# Patient Record
Sex: Female | Born: 1940 | Race: White | Hispanic: No | Marital: Married | Smoking: Former smoker
Health system: Southern US, Community
[De-identification: ages and names within clinical notes are randomized; demographics above are authoritative.]

## PROBLEM LIST (undated history)

## (undated) DIAGNOSIS — F329 Major depressive disorder, single episode, unspecified: Secondary | ICD-10-CM

## (undated) DIAGNOSIS — C801 Malignant (primary) neoplasm, unspecified: Secondary | ICD-10-CM

## (undated) DIAGNOSIS — M961 Postlaminectomy syndrome, not elsewhere classified: Secondary | ICD-10-CM

## (undated) DIAGNOSIS — M47817 Spondylosis without myelopathy or radiculopathy, lumbosacral region: Secondary | ICD-10-CM

## (undated) DIAGNOSIS — R209 Unspecified disturbances of skin sensation: Secondary | ICD-10-CM

## (undated) DIAGNOSIS — F32A Depression, unspecified: Secondary | ICD-10-CM

## (undated) DIAGNOSIS — I10 Essential (primary) hypertension: Secondary | ICD-10-CM

## (undated) DIAGNOSIS — IMO0002 Reserved for concepts with insufficient information to code with codable children: Secondary | ICD-10-CM

## (undated) DIAGNOSIS — E785 Hyperlipidemia, unspecified: Secondary | ICD-10-CM

## (undated) DIAGNOSIS — M810 Age-related osteoporosis without current pathological fracture: Secondary | ICD-10-CM

## (undated) DIAGNOSIS — M533 Sacrococcygeal disorders, not elsewhere classified: Secondary | ICD-10-CM

## (undated) HISTORY — DX: Hyperlipidemia, unspecified: E78.5

## (undated) HISTORY — DX: Malignant (primary) neoplasm, unspecified: C80.1

## (undated) HISTORY — DX: Depression, unspecified: F32.A

## (undated) HISTORY — DX: Unspecified disturbances of skin sensation: R20.9

## (undated) HISTORY — DX: Spondylosis without myelopathy or radiculopathy, lumbosacral region: M47.817

## (undated) HISTORY — DX: Age-related osteoporosis without current pathological fracture: M81.0

## (undated) HISTORY — DX: Essential (primary) hypertension: I10

## (undated) HISTORY — DX: Sacrococcygeal disorders, not elsewhere classified: M53.3

## (undated) HISTORY — PX: EYE SURGERY: SHX253

## (undated) HISTORY — PX: VAGOTOMY AND PYLOROPLASTY: SHX831

## (undated) HISTORY — DX: Postlaminectomy syndrome, not elsewhere classified: M96.1

## (undated) HISTORY — DX: Major depressive disorder, single episode, unspecified: F32.9

## (undated) HISTORY — PX: OTHER SURGICAL HISTORY: SHX169

## (undated) HISTORY — PX: SPINE SURGERY: SHX786

## (undated) HISTORY — DX: Reserved for concepts with insufficient information to code with codable children: IMO0002

---

## 1998-07-04 ENCOUNTER — Other Ambulatory Visit: Admission: RE | Admit: 1998-07-04 | Discharge: 1998-07-04 | Payer: Self-pay | Admitting: Obstetrics and Gynecology

## 1999-10-10 ENCOUNTER — Encounter: Admission: RE | Admit: 1999-10-10 | Discharge: 1999-10-10 | Payer: Self-pay | Admitting: Obstetrics and Gynecology

## 1999-10-10 ENCOUNTER — Encounter: Payer: Self-pay | Admitting: Obstetrics and Gynecology

## 1999-10-22 ENCOUNTER — Encounter: Admission: RE | Admit: 1999-10-22 | Discharge: 1999-10-22 | Payer: Self-pay | Admitting: Obstetrics and Gynecology

## 1999-10-22 ENCOUNTER — Encounter: Payer: Self-pay | Admitting: Obstetrics and Gynecology

## 1999-11-13 ENCOUNTER — Other Ambulatory Visit: Admission: RE | Admit: 1999-11-13 | Discharge: 1999-11-13 | Payer: Self-pay | Admitting: Obstetrics and Gynecology

## 2001-01-13 ENCOUNTER — Other Ambulatory Visit: Admission: RE | Admit: 2001-01-13 | Discharge: 2001-01-13 | Payer: Self-pay | Admitting: Obstetrics and Gynecology

## 2001-05-02 ENCOUNTER — Encounter: Payer: Self-pay | Admitting: Obstetrics and Gynecology

## 2001-05-02 ENCOUNTER — Encounter: Admission: RE | Admit: 2001-05-02 | Discharge: 2001-05-02 | Payer: Self-pay | Admitting: Obstetrics and Gynecology

## 2002-03-29 ENCOUNTER — Other Ambulatory Visit: Admission: RE | Admit: 2002-03-29 | Discharge: 2002-03-29 | Payer: Self-pay | Admitting: Obstetrics and Gynecology

## 2002-05-04 ENCOUNTER — Encounter: Admission: RE | Admit: 2002-05-04 | Discharge: 2002-05-04 | Payer: Self-pay | Admitting: Obstetrics and Gynecology

## 2002-05-04 ENCOUNTER — Encounter: Payer: Self-pay | Admitting: Obstetrics and Gynecology

## 2003-06-05 ENCOUNTER — Other Ambulatory Visit: Admission: RE | Admit: 2003-06-05 | Discharge: 2003-06-05 | Payer: Self-pay | Admitting: Obstetrics and Gynecology

## 2003-07-17 ENCOUNTER — Encounter: Payer: Self-pay | Admitting: Obstetrics and Gynecology

## 2003-07-17 ENCOUNTER — Encounter: Admission: RE | Admit: 2003-07-17 | Discharge: 2003-07-17 | Payer: Self-pay | Admitting: Obstetrics and Gynecology

## 2004-09-22 ENCOUNTER — Encounter: Admission: RE | Admit: 2004-09-22 | Discharge: 2004-09-22 | Payer: Self-pay | Admitting: Emergency Medicine

## 2004-10-01 ENCOUNTER — Encounter: Admission: RE | Admit: 2004-10-01 | Discharge: 2004-10-01 | Payer: Self-pay | Admitting: Emergency Medicine

## 2004-10-15 ENCOUNTER — Encounter: Admission: RE | Admit: 2004-10-15 | Discharge: 2004-10-15 | Payer: Self-pay | Admitting: Emergency Medicine

## 2004-10-29 ENCOUNTER — Encounter: Admission: RE | Admit: 2004-10-29 | Discharge: 2004-10-29 | Payer: Self-pay | Admitting: Emergency Medicine

## 2005-03-18 ENCOUNTER — Ambulatory Visit (HOSPITAL_COMMUNITY): Admission: RE | Admit: 2005-03-18 | Discharge: 2005-03-18 | Payer: Self-pay | Admitting: Gastroenterology

## 2005-09-14 ENCOUNTER — Other Ambulatory Visit: Admission: RE | Admit: 2005-09-14 | Discharge: 2005-09-14 | Payer: Self-pay | Admitting: Obstetrics and Gynecology

## 2005-11-02 ENCOUNTER — Encounter: Admission: RE | Admit: 2005-11-02 | Discharge: 2005-11-02 | Payer: Self-pay | Admitting: Obstetrics and Gynecology

## 2006-03-09 ENCOUNTER — Encounter: Admission: RE | Admit: 2006-03-09 | Discharge: 2006-03-09 | Payer: Self-pay | Admitting: Orthopaedic Surgery

## 2006-11-09 ENCOUNTER — Encounter: Admission: RE | Admit: 2006-11-09 | Discharge: 2006-11-09 | Payer: Self-pay | Admitting: Family Medicine

## 2006-11-15 ENCOUNTER — Other Ambulatory Visit: Admission: RE | Admit: 2006-11-15 | Discharge: 2006-11-15 | Payer: Self-pay | Admitting: Obstetrics and Gynecology

## 2007-02-04 ENCOUNTER — Inpatient Hospital Stay (HOSPITAL_BASED_OUTPATIENT_CLINIC_OR_DEPARTMENT_OTHER): Admission: RE | Admit: 2007-02-04 | Discharge: 2007-02-04 | Payer: Self-pay | Admitting: Cardiovascular Disease

## 2007-04-20 ENCOUNTER — Ambulatory Visit (HOSPITAL_COMMUNITY): Admission: RE | Admit: 2007-04-20 | Discharge: 2007-04-20 | Payer: Self-pay | Admitting: Gastroenterology

## 2007-04-20 ENCOUNTER — Encounter (INDEPENDENT_AMBULATORY_CARE_PROVIDER_SITE_OTHER): Payer: Self-pay | Admitting: Gastroenterology

## 2007-04-21 ENCOUNTER — Encounter: Admission: RE | Admit: 2007-04-21 | Discharge: 2007-04-21 | Payer: Self-pay | Admitting: Physician Assistant

## 2007-06-08 ENCOUNTER — Encounter (INDEPENDENT_AMBULATORY_CARE_PROVIDER_SITE_OTHER): Payer: Self-pay | Admitting: Gastroenterology

## 2007-06-08 ENCOUNTER — Ambulatory Visit (HOSPITAL_COMMUNITY): Admission: RE | Admit: 2007-06-08 | Discharge: 2007-06-08 | Payer: Self-pay | Admitting: Gastroenterology

## 2007-11-14 ENCOUNTER — Encounter: Admission: RE | Admit: 2007-11-14 | Discharge: 2007-11-14 | Payer: Self-pay | Admitting: Obstetrics and Gynecology

## 2007-11-17 ENCOUNTER — Other Ambulatory Visit: Admission: RE | Admit: 2007-11-17 | Discharge: 2007-11-17 | Payer: Self-pay | Admitting: Obstetrics and Gynecology

## 2007-11-18 ENCOUNTER — Encounter: Admission: RE | Admit: 2007-11-18 | Discharge: 2007-11-18 | Payer: Self-pay | Admitting: Obstetrics and Gynecology

## 2008-10-26 ENCOUNTER — Encounter: Admission: RE | Admit: 2008-10-26 | Discharge: 2008-10-26 | Payer: Self-pay | Admitting: Emergency Medicine

## 2008-11-02 ENCOUNTER — Encounter: Admission: RE | Admit: 2008-11-02 | Discharge: 2008-11-02 | Payer: Self-pay | Admitting: Emergency Medicine

## 2008-11-16 ENCOUNTER — Encounter: Admission: RE | Admit: 2008-11-16 | Discharge: 2008-11-16 | Payer: Self-pay | Admitting: Emergency Medicine

## 2009-01-01 ENCOUNTER — Encounter: Admission: RE | Admit: 2009-01-01 | Discharge: 2009-01-01 | Payer: Self-pay | Admitting: Obstetrics and Gynecology

## 2009-01-04 ENCOUNTER — Other Ambulatory Visit: Admission: RE | Admit: 2009-01-04 | Discharge: 2009-01-04 | Payer: Self-pay | Admitting: Obstetrics and Gynecology

## 2009-02-22 ENCOUNTER — Encounter: Admission: RE | Admit: 2009-02-22 | Discharge: 2009-02-22 | Payer: Self-pay | Admitting: Emergency Medicine

## 2009-04-15 ENCOUNTER — Inpatient Hospital Stay (HOSPITAL_COMMUNITY): Admission: EM | Admit: 2009-04-15 | Discharge: 2009-04-27 | Payer: Self-pay | Admitting: Emergency Medicine

## 2009-04-17 ENCOUNTER — Encounter (INDEPENDENT_AMBULATORY_CARE_PROVIDER_SITE_OTHER): Payer: Self-pay | Admitting: Gastroenterology

## 2009-04-19 ENCOUNTER — Encounter (INDEPENDENT_AMBULATORY_CARE_PROVIDER_SITE_OTHER): Payer: Self-pay | Admitting: General Surgery

## 2009-06-13 ENCOUNTER — Encounter: Admission: RE | Admit: 2009-06-13 | Discharge: 2009-06-13 | Payer: Self-pay | Admitting: Neurosurgery

## 2009-08-09 ENCOUNTER — Encounter
Admission: RE | Admit: 2009-08-09 | Discharge: 2009-10-15 | Payer: Self-pay | Admitting: Physical Medicine & Rehabilitation

## 2009-08-12 ENCOUNTER — Ambulatory Visit: Payer: Self-pay | Admitting: Physical Medicine & Rehabilitation

## 2009-08-19 ENCOUNTER — Ambulatory Visit: Payer: Self-pay | Admitting: Physical Medicine & Rehabilitation

## 2009-09-23 ENCOUNTER — Ambulatory Visit: Payer: Self-pay | Admitting: Physical Medicine & Rehabilitation

## 2009-10-28 ENCOUNTER — Ambulatory Visit: Payer: Self-pay | Admitting: Physical Medicine & Rehabilitation

## 2009-10-28 ENCOUNTER — Encounter
Admission: RE | Admit: 2009-10-28 | Discharge: 2010-01-26 | Payer: Self-pay | Admitting: Physical Medicine & Rehabilitation

## 2009-12-09 ENCOUNTER — Ambulatory Visit: Payer: Self-pay | Admitting: Physical Medicine & Rehabilitation

## 2009-12-17 ENCOUNTER — Encounter: Admission: RE | Admit: 2009-12-17 | Discharge: 2009-12-17 | Payer: Self-pay | Admitting: Emergency Medicine

## 2010-01-03 ENCOUNTER — Encounter: Admission: RE | Admit: 2010-01-03 | Discharge: 2010-01-03 | Payer: Self-pay | Admitting: Emergency Medicine

## 2010-01-10 ENCOUNTER — Ambulatory Visit: Payer: Self-pay | Admitting: Physical Medicine & Rehabilitation

## 2010-02-18 ENCOUNTER — Encounter
Admission: RE | Admit: 2010-02-18 | Discharge: 2010-05-19 | Payer: Self-pay | Admitting: Physical Medicine & Rehabilitation

## 2010-02-21 ENCOUNTER — Ambulatory Visit: Payer: Self-pay | Admitting: Physical Medicine & Rehabilitation

## 2010-03-31 ENCOUNTER — Ambulatory Visit: Payer: Self-pay | Admitting: Physical Medicine & Rehabilitation

## 2010-05-09 ENCOUNTER — Ambulatory Visit: Payer: Self-pay | Admitting: Physical Medicine & Rehabilitation

## 2010-07-03 ENCOUNTER — Encounter
Admission: RE | Admit: 2010-07-03 | Discharge: 2010-07-07 | Payer: Self-pay | Source: Home / Self Care | Attending: Physical Medicine & Rehabilitation | Admitting: Physical Medicine & Rehabilitation

## 2010-07-07 ENCOUNTER — Ambulatory Visit: Payer: Self-pay | Admitting: Physical Medicine & Rehabilitation

## 2010-07-16 ENCOUNTER — Ambulatory Visit: Payer: Self-pay | Admitting: Cardiovascular Disease

## 2010-10-01 ENCOUNTER — Encounter
Admission: RE | Admit: 2010-10-01 | Discharge: 2010-10-07 | Payer: Self-pay | Source: Home / Self Care | Attending: Physical Medicine & Rehabilitation | Admitting: Physical Medicine & Rehabilitation

## 2010-10-07 ENCOUNTER — Ambulatory Visit: Payer: Self-pay | Admitting: Physical Medicine & Rehabilitation

## 2010-11-09 ENCOUNTER — Encounter: Payer: Self-pay | Admitting: Gastroenterology

## 2010-11-10 ENCOUNTER — Encounter: Payer: Self-pay | Admitting: Neurosurgery

## 2010-11-13 ENCOUNTER — Encounter
Admission: RE | Admit: 2010-11-13 | Discharge: 2010-11-18 | Payer: Self-pay | Source: Home / Self Care | Attending: Physical Medicine & Rehabilitation | Admitting: Physical Medicine & Rehabilitation

## 2010-11-17 ENCOUNTER — Ambulatory Visit
Admission: RE | Admit: 2010-11-17 | Discharge: 2010-11-17 | Payer: Self-pay | Source: Home / Self Care | Attending: Physical Medicine & Rehabilitation | Admitting: Physical Medicine & Rehabilitation

## 2010-12-02 ENCOUNTER — Other Ambulatory Visit: Payer: Self-pay | Admitting: Emergency Medicine

## 2010-12-02 ENCOUNTER — Ambulatory Visit
Admission: RE | Admit: 2010-12-02 | Discharge: 2010-12-02 | Disposition: A | Discharge status: Home / Self Care | Payer: Self-pay | Source: Ambulatory Visit | Attending: Emergency Medicine | Admitting: Emergency Medicine

## 2010-12-02 DIAGNOSIS — M25476 Effusion, unspecified foot: Secondary | ICD-10-CM

## 2010-12-02 DIAGNOSIS — M25473 Effusion, unspecified ankle: Secondary | ICD-10-CM

## 2010-12-16 ENCOUNTER — Encounter: Payer: Medicare Other | Attending: Physical Medicine & Rehabilitation

## 2010-12-16 ENCOUNTER — Ambulatory Visit: Payer: Medicare Other | Admitting: Physical Medicine & Rehabilitation

## 2010-12-16 DIAGNOSIS — M47817 Spondylosis without myelopathy or radiculopathy, lumbosacral region: Secondary | ICD-10-CM

## 2010-12-16 DIAGNOSIS — M545 Low back pain, unspecified: Secondary | ICD-10-CM | POA: Insufficient documentation

## 2010-12-17 ENCOUNTER — Other Ambulatory Visit: Payer: Self-pay | Admitting: Emergency Medicine

## 2010-12-17 DIAGNOSIS — Z1231 Encounter for screening mammogram for malignant neoplasm of breast: Secondary | ICD-10-CM

## 2011-01-05 ENCOUNTER — Ambulatory Visit
Admission: RE | Admit: 2011-01-05 | Discharge: 2011-01-05 | Disposition: A | Discharge status: Home / Self Care | Payer: Medicare Other | Source: Ambulatory Visit | Attending: Emergency Medicine | Admitting: Emergency Medicine

## 2011-01-05 DIAGNOSIS — Z1231 Encounter for screening mammogram for malignant neoplasm of breast: Secondary | ICD-10-CM

## 2011-01-09 NOTE — Assessment & Plan Note (Addendum)
REASON FOR VISIT:  Right greater than left-sided back pain.  HISTORY:  Lumbar spondylosis, has had 100% relief which was several hours with medial branch blocks, bilateral L2, L3, L4 on Feb 21, 2010. Repeat March 31, 2010 with greater than 50% relief and then repeated once again November 17, 2010 with a short-term 80% relief and now is down to 50%.  A 45-minute walking tolerance, ability to climb steps is positive, does not drive.  Oswestry score 50% indicating moderate-to-severe disability from back.  PHYSICAL EXAMINATION:  Lower extremity strength is normal.  Straight leg raising test is negative.  Deep tendon reflexes are normal in the lower extremity.  She has tenderness to palpation lumbar paraspinals, at the L4 and above level on the right side greater than left side.  Pain exist with flexion, extension, lateral rotation, bending and twisting.  IMPRESSION:  Lumbar spondylosis without myelopathy post laminectomy syndrome.  She does have x-ray evidence of facet arthrosis at L3-4 and L4-5 levels.  We will set her radiofrequency enterotomy right side first and if successful on that side, we would proceed on left side. Discussed with the patient husband, went over risks and benefits.  They elect to schedule.  We will premedicate with 5 mg of Valium.     Erick Colace, M.D. Electronically Signed    AEK/MedQ D:  12/16/2010 09:19:03  T:  12/16/2010 09:38:22  Job #:  [id number]

## 2011-01-25 LAB — CBC
HCT: 34.4 % — ABNORMAL LOW (ref 36.0–46.0)
HCT: 34.8 % — ABNORMAL LOW (ref 36.0–46.0)
HCT: 35.3 % — ABNORMAL LOW (ref 36.0–46.0)
HCT: 36.3 % (ref 36.0–46.0)
Hemoglobin: 11.9 g/dL — ABNORMAL LOW (ref 12.0–15.0)
Hemoglobin: 12.1 g/dL (ref 12.0–15.0)
MCHC: 33.7 g/dL (ref 30.0–36.0)
MCHC: 34.1 g/dL (ref 30.0–36.0)
MCHC: 34.2 g/dL (ref 30.0–36.0)
MCHC: 34.2 g/dL (ref 30.0–36.0)
MCHC: 34.3 g/dL (ref 30.0–36.0)
MCV: 100.6 fL — ABNORMAL HIGH (ref 78.0–100.0)
MCV: 100.9 fL — ABNORMAL HIGH (ref 78.0–100.0)
MCV: 101 fL — ABNORMAL HIGH (ref 78.0–100.0)
MCV: 102.7 fL — ABNORMAL HIGH (ref 78.0–100.0)
MCV: 102.7 fL — ABNORMAL HIGH (ref 78.0–100.0)
Platelets: 206 10*3/uL (ref 150–400)
Platelets: 209 10*3/uL (ref 150–400)
Platelets: 215 10*3/uL (ref 150–400)
Platelets: 231 10*3/uL (ref 150–400)
Platelets: 242 10*3/uL (ref 150–400)
RBC: 3.27 MIL/uL — ABNORMAL LOW (ref 3.87–5.11)
RBC: 3.43 MIL/uL — ABNORMAL LOW (ref 3.87–5.11)
RBC: 3.48 MIL/uL — ABNORMAL LOW (ref 3.87–5.11)
RDW: 12.6 % (ref 11.5–15.5)
RDW: 12.7 % (ref 11.5–15.5)
RDW: 13 % (ref 11.5–15.5)
RDW: 13.1 % (ref 11.5–15.5)
WBC: 11.4 10*3/uL — ABNORMAL HIGH (ref 4.0–10.5)
WBC: 12 10*3/uL — ABNORMAL HIGH (ref 4.0–10.5)
WBC: 12.3 10*3/uL — ABNORMAL HIGH (ref 4.0–10.5)
WBC: 19.3 10*3/uL — ABNORMAL HIGH (ref 4.0–10.5)

## 2011-01-25 LAB — DIFFERENTIAL
Basophils Absolute: 0 10*3/uL (ref 0.0–0.1)
Basophils Absolute: 0 10*3/uL (ref 0.0–0.1)
Basophils Absolute: 0 10*3/uL (ref 0.0–0.1)
Basophils Absolute: 0.1 10*3/uL (ref 0.0–0.1)
Basophils Relative: 0 % (ref 0–1)
Basophils Relative: 0 % (ref 0–1)
Basophils Relative: 0 % (ref 0–1)
Basophils Relative: 1 % (ref 0–1)
Eosinophils Absolute: 0 10*3/uL (ref 0.0–0.7)
Eosinophils Absolute: 0.3 10*3/uL (ref 0.0–0.7)
Eosinophils Absolute: 0.4 10*3/uL (ref 0.0–0.7)
Eosinophils Absolute: 0.4 10*3/uL (ref 0.0–0.7)
Eosinophils Relative: 0 % (ref 0–5)
Eosinophils Relative: 3 % (ref 0–5)
Eosinophils Relative: 3 % (ref 0–5)
Lymphocytes Relative: 18 % (ref 12–46)
Lymphocytes Relative: 2 % — ABNORMAL LOW (ref 12–46)
Lymphocytes Relative: 7 % — ABNORMAL LOW (ref 12–46)
Lymphs Abs: 0.4 10*3/uL — ABNORMAL LOW (ref 0.7–4.0)
Lymphs Abs: 1.4 10*3/uL (ref 0.7–4.0)
Lymphs Abs: 2.2 10*3/uL (ref 0.7–4.0)
Monocytes Absolute: 0.5 10*3/uL (ref 0.1–1.0)
Monocytes Absolute: 1.1 10*3/uL — ABNORMAL HIGH (ref 0.1–1.0)
Monocytes Relative: 11 % (ref 3–12)
Monocytes Relative: 3 % (ref 3–12)
Neutro Abs: 13.6 10*3/uL — ABNORMAL HIGH (ref 1.7–7.7)
Neutro Abs: 8.6 10*3/uL — ABNORMAL HIGH (ref 1.7–7.7)
Neutro Abs: 8.6 10*3/uL — ABNORMAL HIGH (ref 1.7–7.7)
Neutrophils Relative %: 73 % (ref 43–77)
Neutrophils Relative %: 74 % (ref 43–77)
Neutrophils Relative %: 75 % (ref 43–77)
Neutrophils Relative %: 86 % — ABNORMAL HIGH (ref 43–77)

## 2011-01-25 LAB — GLUCOSE, CAPILLARY
Glucose-Capillary: 102 mg/dL — ABNORMAL HIGH (ref 70–99)
Glucose-Capillary: 104 mg/dL — ABNORMAL HIGH (ref 70–99)
Glucose-Capillary: 107 mg/dL — ABNORMAL HIGH (ref 70–99)
Glucose-Capillary: 108 mg/dL — ABNORMAL HIGH (ref 70–99)
Glucose-Capillary: 108 mg/dL — ABNORMAL HIGH (ref 70–99)
Glucose-Capillary: 113 mg/dL — ABNORMAL HIGH (ref 70–99)
Glucose-Capillary: 113 mg/dL — ABNORMAL HIGH (ref 70–99)
Glucose-Capillary: 114 mg/dL — ABNORMAL HIGH (ref 70–99)
Glucose-Capillary: 116 mg/dL — ABNORMAL HIGH (ref 70–99)
Glucose-Capillary: 118 mg/dL — ABNORMAL HIGH (ref 70–99)
Glucose-Capillary: 123 mg/dL — ABNORMAL HIGH (ref 70–99)
Glucose-Capillary: 126 mg/dL — ABNORMAL HIGH (ref 70–99)
Glucose-Capillary: 126 mg/dL — ABNORMAL HIGH (ref 70–99)
Glucose-Capillary: 126 mg/dL — ABNORMAL HIGH (ref 70–99)
Glucose-Capillary: 127 mg/dL — ABNORMAL HIGH (ref 70–99)
Glucose-Capillary: 129 mg/dL — ABNORMAL HIGH (ref 70–99)
Glucose-Capillary: 135 mg/dL — ABNORMAL HIGH (ref 70–99)
Glucose-Capillary: 138 mg/dL — ABNORMAL HIGH (ref 70–99)
Glucose-Capillary: 143 mg/dL — ABNORMAL HIGH (ref 70–99)
Glucose-Capillary: 161 mg/dL — ABNORMAL HIGH (ref 70–99)
Glucose-Capillary: 245 mg/dL — ABNORMAL HIGH (ref 70–99)
Glucose-Capillary: 246 mg/dL — ABNORMAL HIGH (ref 70–99)
Glucose-Capillary: 93 mg/dL (ref 70–99)
Glucose-Capillary: 99 mg/dL (ref 70–99)
Glucose-Capillary: 99 mg/dL (ref 70–99)

## 2011-01-25 LAB — BASIC METABOLIC PANEL
BUN: 1 mg/dL — ABNORMAL LOW (ref 6–23)
BUN: 2 mg/dL — ABNORMAL LOW (ref 6–23)
BUN: 3 mg/dL — ABNORMAL LOW (ref 6–23)
BUN: 3 mg/dL — ABNORMAL LOW (ref 6–23)
BUN: 3 mg/dL — ABNORMAL LOW (ref 6–23)
BUN: 5 mg/dL — ABNORMAL LOW (ref 6–23)
CO2: 23 mEq/L (ref 19–32)
CO2: 26 mEq/L (ref 19–32)
CO2: 26 mEq/L (ref 19–32)
CO2: 28 mEq/L (ref 19–32)
Calcium: 7.7 mg/dL — ABNORMAL LOW (ref 8.4–10.5)
Calcium: 8 mg/dL — ABNORMAL LOW (ref 8.4–10.5)
Calcium: 8.3 mg/dL — ABNORMAL LOW (ref 8.4–10.5)
Calcium: 8.5 mg/dL (ref 8.4–10.5)
Calcium: 8.7 mg/dL (ref 8.4–10.5)
Chloride: 103 mEq/L (ref 96–112)
Chloride: 105 mEq/L (ref 96–112)
Chloride: 106 mEq/L (ref 96–112)
Chloride: 109 mEq/L (ref 96–112)
Creatinine, Ser: 0.64 mg/dL (ref 0.4–1.2)
Creatinine, Ser: 0.65 mg/dL (ref 0.4–1.2)
Creatinine, Ser: 0.72 mg/dL (ref 0.4–1.2)
Creatinine, Ser: 0.76 mg/dL (ref 0.4–1.2)
Creatinine, Ser: 0.84 mg/dL (ref 0.4–1.2)
GFR calc Af Amer: >60 mL/min (ref >60)
GFR calc Af Amer: >60 mL/min (ref >60)
GFR calc Af Amer: >60 mL/min (ref >60)
GFR calc Af Amer: >60 mL/min (ref >60)
GFR calc non Af Amer: >60 mL/min (ref >60)
GFR calc non Af Amer: >60 mL/min (ref >60)
GFR calc non Af Amer: >60 mL/min (ref >60)
GFR calc non Af Amer: >60 mL/min (ref >60)
GFR calc non Af Amer: >60 mL/min (ref >60)
Glucose, Bld: 108 mg/dL — ABNORMAL HIGH (ref 70–99)
Glucose, Bld: 114 mg/dL — ABNORMAL HIGH (ref 70–99)
Glucose, Bld: 116 mg/dL — ABNORMAL HIGH (ref 70–99)
Glucose, Bld: 132 mg/dL — ABNORMAL HIGH (ref 70–99)
Glucose, Bld: 251 mg/dL — ABNORMAL HIGH (ref 70–99)
Glucose, Bld: 98 mg/dL (ref 70–99)
Potassium: 3.1 mEq/L — ABNORMAL LOW (ref 3.5–5.1)
Potassium: 3.3 mEq/L — ABNORMAL LOW (ref 3.5–5.1)
Potassium: 3.6 mEq/L (ref 3.5–5.1)
Potassium: 4.7 mEq/L (ref 3.5–5.1)
Sodium: 138 mEq/L (ref 135–145)
Sodium: 140 mEq/L (ref 135–145)
Sodium: 142 mEq/L (ref 135–145)

## 2011-01-25 LAB — MAGNESIUM
Magnesium: 0.8 mg/dL — CL (ref 1.5–2.5)
Magnesium: 1 mg/dL — ABNORMAL LOW (ref 1.5–2.5)
Magnesium: 1.2 mg/dL — ABNORMAL LOW (ref 1.5–2.5)
Magnesium: 1.5 mg/dL (ref 1.5–2.5)
Magnesium: 1.8 mg/dL (ref 1.5–2.5)
Magnesium: 1.8 mg/dL (ref 1.5–2.5)
Magnesium: 2.4 mg/dL (ref 1.5–2.5)

## 2011-01-25 LAB — HEMOGLOBIN A1C: Hgb A1c MFr Bld: 5.7 % (ref 4.6–6.1)

## 2011-01-25 LAB — PHOSPHORUS: Phosphorus: 3.4 mg/dL (ref 2.3–4.6)

## 2011-01-25 LAB — VITAMIN B12: Vitamin B-12: 186 pg/mL — ABNORMAL LOW (ref 211–911)

## 2011-01-25 LAB — FOLATE: Folate: >20 ng/mL

## 2011-01-26 LAB — POCT I-STAT, CHEM 8
BUN: 23 mg/dL (ref 6–23)
Calcium, Ion: 0.9 mmol/L — ABNORMAL LOW (ref 1.12–1.32)
Creatinine, Ser: 1.6 mg/dL — ABNORMAL HIGH (ref 0.4–1.2)
Sodium: 130 mEq/L — ABNORMAL LOW (ref 135–145)
TCO2: 38 mmol/L (ref 0–100)

## 2011-01-26 LAB — CBC
HCT: 35.2 % — ABNORMAL LOW (ref 36.0–46.0)
HCT: 45.7 % (ref 36.0–46.0)
Hemoglobin: 11.6 g/dL — ABNORMAL LOW (ref 12.0–15.0)
Hemoglobin: 15.4 g/dL — ABNORMAL HIGH (ref 12.0–15.0)
MCHC: 33.7 g/dL (ref 30.0–36.0)
MCHC: 33.8 g/dL (ref 30.0–36.0)
MCV: 101.9 fL — ABNORMAL HIGH (ref 78.0–100.0)
MCV: 103 fL — ABNORMAL HIGH (ref 78.0–100.0)
MCV: 103.1 fL — ABNORMAL HIGH (ref 78.0–100.0)
Platelets: 263 K/uL (ref 150–400)
RBC: 3.3 MIL/uL — ABNORMAL LOW (ref 3.87–5.11)
RBC: 3.41 MIL/uL — ABNORMAL LOW (ref 3.87–5.11)
RBC: 4.48 MIL/uL (ref 3.87–5.11)
RDW: 13.5 % (ref 11.5–15.5)
WBC: 14.7 K/uL — ABNORMAL HIGH (ref 4.0–10.5)
WBC: 9.3 10*3/uL (ref 4.0–10.5)

## 2011-01-26 LAB — PROTIME-INR: Prothrombin Time: 13.6 seconds (ref 11.6–15.2)

## 2011-01-26 LAB — DIFFERENTIAL
Eosinophils Relative: 0 % (ref 0–5)
Lymphocytes Relative: 10 % — ABNORMAL LOW (ref 12–46)
Lymphs Abs: 1.5 10*3/uL (ref 0.7–4.0)
Monocytes Absolute: 1.6 10*3/uL — ABNORMAL HIGH (ref 0.1–1.0)

## 2011-01-26 LAB — LIPASE, BLOOD: Lipase: 16 U/L (ref 11–59)

## 2011-01-26 LAB — HEPATIC FUNCTION PANEL
Alkaline Phosphatase: 38 U/L — ABNORMAL LOW (ref 39–117)
Bilirubin, Direct: 0.1 mg/dL (ref 0.0–0.3)
Indirect Bilirubin: 0.4 mg/dL (ref 0.3–0.9)
Total Protein: 5.4 g/dL — ABNORMAL LOW (ref 6.0–8.3)

## 2011-01-26 LAB — BASIC METABOLIC PANEL
BUN: 18 mg/dL (ref 6–23)
BUN: 7 mg/dL (ref 6–23)
CO2: 30 mEq/L (ref 19–32)
Chloride: 100 mEq/L (ref 96–112)
Chloride: 106 mEq/L (ref 96–112)
Creatinine, Ser: 1.13 mg/dL (ref 0.4–1.2)
GFR calc Af Amer: 58 mL/min — ABNORMAL LOW (ref >60)
GFR calc Af Amer: >60 mL/min (ref >60)
Potassium: 3.2 mEq/L — ABNORMAL LOW (ref 3.5–5.1)
Sodium: 139 mEq/L (ref 135–145)

## 2011-01-26 LAB — APTT
aPTT: 24 s (ref 24–37)
aPTT: 24 seconds (ref 24–37)

## 2011-01-26 LAB — GLUCOSE, CAPILLARY
Glucose-Capillary: 101 mg/dL — ABNORMAL HIGH (ref 70–99)
Glucose-Capillary: 95 mg/dL (ref 70–99)

## 2011-01-29 ENCOUNTER — Encounter: Payer: Medicare Other | Attending: Physical Medicine & Rehabilitation

## 2011-01-29 ENCOUNTER — Encounter: Payer: Medicare Other | Admitting: Physical Medicine & Rehabilitation

## 2011-01-29 DIAGNOSIS — M47817 Spondylosis without myelopathy or radiculopathy, lumbosacral region: Secondary | ICD-10-CM

## 2011-01-29 DIAGNOSIS — M545 Low back pain, unspecified: Secondary | ICD-10-CM | POA: Insufficient documentation

## 2011-02-12 NOTE — Procedures (Signed)
NAME:  [name], [name]                ACCOUNT NO.:  [id number]  MEDICAL RECORD NO.:  [id number]           PATIENT TYPE:  O  LOCATION:  TPC                          FACILITY:  [facility]  PHYSICIAN:  Erick Colace, M.D.DATE OF BIRTH:  Aug 12, 1941  DATE OF PROCEDURE:  01/29/2011 DATE OF DISCHARGE:                              OPERATIVE REPORT  PROCEDURE:  Right L2, L3, L4, medial branch and right L5 dorsal ramus radiofrequency neurotomy under fluoroscopic guidance.  INDICATIONS:  Lumbar spondylosis with improvement of pain of 80-100% after two sets of medial branch blocks here for the right side.  Her pain is only partially responsive to medication management including narcotic analgesic medications and interferes with self-care and mobility.  Informed consent was obtained after describing risks and benefits of the procedure with the patient.  These include bleeding, bruising, and infection.  She elects to proceed and has given written consent.  The patient placed prone on fluoroscopy table.  Betadine prep, sterile drape, 25-gauge inch and half needle was used to anesthetize the skin and subcu tissue 1% lidocaine x2 mL at each of the four sites. Then, a 20-gauge, 10-cm RF needle with 10-mm curved active tip was inserted under fluoroscopic guidance first starting at the right S1 SAP sacroiliac junction, bone contact made, confirmed with lateral imaging. Sensory stem at 50 Hz, followed by motor stem at 2 Hz confirmed proper needle location followed by injection of 1 mL dexamethasone lidocaine solution which consists of 1 mL of 4 mg/mL dexamethasone and 4 mL of 1% MPF lidocaine followed by radiofrequency lesioning 70 degrees Celsius for 70 seconds.  Then, the right L5 SAP transverse process junction targeted, bone contact made, confirmed with lateral imaging.  Sensory stem at 50 Hz followed by motor stem at 2 Hz confirmed proper needle location followed by injection of 1 mL of dexamethasone  lidocaine solution and radiofrequency lesioning 70 degrees Celsius for 70 seconds in the right L4 SAP transverse process junction targeted, bone contact made, confirmed with lateral imaging.  Sensory stem at 50 Hz followed by motor stem at 2 Hz confirmed proper needle location followed by injection of 1 mL of dexamethasone lidocaine solution and radiofrequency lesioning 70 degrees Celsius for 70 seconds.  Then, the right L3 SAP transverse process junction targeted, bone contact made, confirmed with lateral imaging.  Sensory stem at 50 Hz followed by motor stem at 2 Hz confirmed proper needle location, followed by injection of 1 mL of dexamethasone lidocaine solution and radiofrequency lesioning 70 degrees Celsius for 70 seconds.  The patient tolerated the procedure well.  Pre and post injection vitals stable.  Post injection instructions given. She will take 1800 mg of gabapentin today after the procedure.  She will see me in 1 month for left side assuming that the right side procedure was helpful in giving her pain relief in the lumbar area of her spine.     Erick Colace, M.D. Electronically Signed    AEK/MEDQ  D:  01/29/2011 13:58:57  T:  01/30/2011 00:37:01  Job:  [id number]

## 2011-02-26 ENCOUNTER — Encounter: Payer: Medicare Other | Admitting: Physical Medicine & Rehabilitation

## 2011-02-26 ENCOUNTER — Encounter: Payer: Medicare Other | Attending: Physical Medicine & Rehabilitation

## 2011-02-26 DIAGNOSIS — M47817 Spondylosis without myelopathy or radiculopathy, lumbosacral region: Secondary | ICD-10-CM | POA: Insufficient documentation

## 2011-02-26 DIAGNOSIS — M545 Low back pain, unspecified: Secondary | ICD-10-CM | POA: Insufficient documentation

## 2011-02-27 NOTE — Procedures (Signed)
NAME:  [name], [name]                ACCOUNT NO.:  [id number]  MEDICAL RECORD NO.:  [id number]           PATIENT TYPE:  O  LOCATION:  TPC                          FACILITY:  [facility]  PHYSICIAN:  Erick Colace, M.D.DATE OF BIRTH:  07/14/1941  DATE OF PROCEDURE:  02/26/2011 DATE OF DISCHARGE:                              OPERATIVE REPORT  This is a left L2, L3, L4 medial branch radiofrequency neurotomy and L5 dorsal ramus radiofrequency neurotomy under fluoroscopic guidance.  INDICATION:  Lumbar spondylosis with chronic pain which has been relieved on 3 occasions with medial branch blocks.  She has had good relief from right-sided radiofrequency performed approximately 1 month ago at corresponding levels.  Informed consent was obtained after describing risks and benefits of the procedure with the patient.  These include bleeding, bruising, and infection.  He elects to proceed and has given written consent.  The patient was placed prone on fluoroscopy table.  Betadine prep, sterile drape, 25-gauge inch and half needle was used to anesthetize the skin and subcu tissue with 1% lidocaine x2 mL at each of 4 sites.  Then, a 20- gauge, 10-cm RF needle was inserted under fluoroscopic guidance targeting first the left S1 SAP sacral ala junction, bone contact made, confirmed with lateral imaging.  Sensory stem at 50 Hz followed by motor stem at 2 Hz confirmed proper needle location followed by injection of 1 mL of solution containing 1 mL of 4 mg/mL dexamethasone and 3 mL of 1% MPF lidocaine followed by radiofrequency lesioning 70 degrees Celsius for 70 seconds.  The left L5 SAP transverse process junction targeted, bone contact made, confirmed with lateral imaging.  Sensory stem at 50 Hz followed by motor stem at 2 Hz confirmed proper needle location followed by injection of 1 mL dexamethasone and lidocaine solution and radiofrequency lesioning 70 degrees Celsius for 70 seconds.  Then,  the left L4 SAP transverse process junction targeted, bone contact made, confirmed with lateral imaging.  Sensory stem at 50 Hz followed by motor stem at 2 Hz confirmed proper needle location followed by injection of 1 mL of dexamethasone and lidocaine solution and radiofrequency lesioning 70 degrees Celsius for 70 seconds.  Then, left L3 SAP transverse process junction targeted, bone contact made, confirmed with lateral imaging. Sensory stem at 50 Hz followed by motor stem at 2 Hz confirmed proper needle location followed by injection of 1 mL of dexamethasone and lidocaine solution and radiofrequency lesioning 70 degrees Celsius for 70 seconds.  The patient tolerated the procedure well.  Postprocedure instructions given.  Follow up in 1 month.     Erick Colace, M.D. Electronically Signed    AEK/MEDQ  D:  02/26/2011 14:07:18  T:  02/27/2011 00:32:15  Job:  [id number]

## 2011-03-03 NOTE — Consult Note (Signed)
[name], [name] NO.:  [id number]   MEDICAL RECORD NO.:  [id number]          PATIENT TYPE:  INP   LOCATION:  3737                         FACILITY:  [facility]   PHYSICIAN:  Anselmo Rod, M.D.  DATE OF BIRTH:  28-Jan-1941   DATE OF CONSULTATION:  04/15/2009  DATE OF DISCHARGE:                                 CONSULTATION   REASON FOR CONSULTATION:  Bright red blood in vomitus.   ASSESSMENT/PLAN:  1. Nausea, vomiting and abdominal pain for 1 week, rule out recurrent      peptic ulcer disease.  2. Hematemesis with melenic stools for the last 3 days, rule out      peptic ulcer disease, esophagitis, Mallory-Weiss tear.  3. History of pyloric stenosis with a questionable recurrent      stricture.  The patient had the pylorus dilated in 2008.  4. Longstanding history of reflux on proton pump inhibitors.  5. Adult onset diabetes mellitus.  6. Hypertension.  7. Hyperlipidemia.  8. Chronic back pain.  9. History of back surgery in the past.   RECOMMENDATIONS:  1. EGD post hydration or earlier on an emergent basis.  2. CT scan of the abdomen and pelvis when the creatinine normalizes.  3. Rehydrate with 2 liters of normal saline now.  4. IV PPI.  5. Allow the patient some ice chips.   DISCUSSION:  [name] is a very pleasant 70 year old white female with  the above-mentioned medical problems who was admitted through [facility]  emergency room today when she presented with a 1-week history of nausea,  vomiting, abdominal pain and hematemesis.  The patient was found to be  dehydrated with a creatinine of 1.6.  She was stabilized and admitted  for further observation and treatment.  The patient claims that she was  in her usual state of health until about a week ago when she started  having periumbilical and epigastric pain with nausea and vomiting.  Three days ago she started throwing up coffee-ground material and  started noticing black stools.  This morning she  started noticing bright  red blood in the vomitus and this prompted her to come to the emergency  room.  Appetite has been poor.  She not been able to keep fluids down  for the last 3-4 days.  There is no history of fever, chills or rigors.  She has a history of pyloric stenosis secondary to ulcer disease and had  a dilatation of the pylorus in 2008.   PAST MEDICAL HISTORY:  See the list above.   ALLERGIES:  POLYMYXIN.   MEDICATIONS:  1. Metformin.  2. Xanax.  3. Clonazepam.  4. Fentanyl patch.  5. Neurontin.  6. Dilaudid p.r.n.  7. Zofran p.r.n.  8. Protonix 40 mg IV.  9. Effexor.  10.Flexeril.   SOCIAL HISTORY:  She is married and lives with her husband.  She is  retired.  She denies the use of alcohol, tobacco or drugs.  She smoked  in the past, but quit in 1991.  Drinks 1-2 alcoholic beverages per day.  There is no  history of any illicit street drug use.   FAMILY HISTORY:  There is no family history of breast, ovarian, uterine  or endometrial cancer.  There is a family history of heart disease and  hypertension.   REVIEW OF SYSTEMS:  1. Nausea and vomiting.  2. Coffee-ground emesis with bright red blood in the emesis.  3. Melenic stools.  4. Epigastric and periumbilical pain.  5. Anxiety.   PHYSICAL EXAMINATION:  GENERAL APPEARANCE:  A very pleasant cooperative  older white female in slight distress.  VITAL SIGNS:  Temperature 98.4, blood pressure 125/90, pulse 87 per  minute, respiratory rate 20.  HEENT:  Atraumatic, normocephalic head.  Facial predictors of the  patient has dry mucous membranes.  NECK:  Supple.  CHEST:  Clear to auscultation.  CARDIOVASCULAR:  S1, S2 regular.  ABDOMEN:  Soft, mildly distended with periumbilical and epigastric  tenderness on palpation with normal bowel sounds.  RECTAL:  Examination was deferred.   LABORATORY DATA:  Laboratory evaluation reveals a white count of 14.7,  hemoglobin of 15.4 liters, platelets of 263 and 79%  neutrophils.  PT is  normal at 13.6 with INR of 1.1, PTT is 24.  BUN is 26 with creatinine of  1.6, chloride 82, sodium 130, potassium 2.5.   PLAN:  As above.  Further tests to be made in follow-up.      Anselmo Rod, M.D.  Electronically Signed     JNM/MEDQ  D:  04/15/2009  T:  04/16/2009  Job:  [id number]   cc:   Reuben Likes, M.D.

## 2011-03-03 NOTE — Op Note (Signed)
NAME:  [name], [name]                ACCOUNT NO.:  [id number]   MEDICAL RECORD NO.:  [id number]          PATIENT TYPE:  AMB   LOCATION:  ENDO                         FACILITY:  [facility]   PHYSICIAN:  Anselmo Rod, M.D.  DATE OF BIRTH:  December 09, 1940   DATE OF PROCEDURE:  04/20/2007  DATE OF DISCHARGE:                               OPERATIVE REPORT   /PROCEDURE PERFORMED/>  Esophagogastroduodenoscopy with antral biopsies.   ENDOSCOPIST:  Anselmo Rod, M.D.   INSTRUMENT USED:  Pentax video panendoscope.   INDICATIONS FOR PROCEDURE:  A 70 year old white female with a history of  melenic stools and anemia, undergoing an EGD, to rule out peptic ulcer  disease, esophagitis, gastritis, etc.  The patient had a drop in her  hemoglobin to 8.6 g/dL.   PROCEDURE PREPARATION:  Informed consent was procured from the patient.  The patient was fasted for 8 hours prior to the procedure. Risks and  benefits of the procedure were discussed with the patient in great  detail.   PREPROCEDURE PHYSICAL EXAMINATION:  VITAL SIGNS:  The patient had stable  vital signs.  NECK:  Supple.  CHEST:  Clear to auscultation.  CARDIOVASCULAR:  S1, S2 regular.  ABDOMEN:  Soft, with normal bowel sounds.   DESCRIPTION OF THE PROCEDURE:  The patient placed in the left lateral  decubitus position and sedated with 100 mcg of Fentanyl and 8 mg of  Versed given intravenously in slow incremental doses. Once the patient  was adequately sedated and maintained on low-flow oxygen and continuous  cardiac monitoring, the Pentax video panendoscope was advanced through  the mouthpiece over the tongue and into the esophagus under direct  vision. Grade 1 distal esophagitis was noted. There was some debris  noted in the antrum from when the patient had eaten the night before.  There was some stricturing of the pylorus noted. In spite of multiple  efforts, I could not advance the scope from the pylorus into the  proximal small  bowel. A channel also was appreciated. There was no  visible vessel noted.into the proximal small bowel.  The proximal small  bowel appeared normal. Antral biopsies were done, as there seemed to be  some prominence of the antrum, question extrinsic compression.  Retroflexion in the high cardia revealed debris. The mucosa underlying  the debris along the greater curvature could not be visualized.   IMPRESSION:  1. Grade 1 distal esophagitis.  2. Some debris along the greater curvature.  3. Antral erosions with a channel ulcer and strictured pylorus.  4. Adult scope could not be advanced through the strictured pylorus.      Therefore, pediatric scope used. Proximal small bowel visualized.      No abnormalities were noted in the proximal small bowel. The antrum      seemed to be somewhat thickened, with prominent mucosal folds.      Biopsies were done.   RECOMMENDATIONS:  1. Await pathology results.  2. Avoid all nonsteroidals including aspirin for now.  3. High-dose PPIs advised.  4. Low-residue diet for the next couple of days.  5.  CT scan of the abdomen and pelvis will be planned tomorrow.  6. Further recommendations to be made in followup.      Anselmo Rod, M.D.  Electronically Signed     JNM/MEDQ  D:  04/20/2007  T:  04/21/2007  Job:  [id number]   cc:   Reuben Likes, M.D.  Fax: [phone]

## 2011-03-03 NOTE — Consult Note (Signed)
[name], Sheri NO.:  [id number]   MEDICAL RECORD NO.:  [id number]          PATIENT TYPE:  INP   LOCATION:  3737                         FACILITY:  [facility]   PHYSICIAN:  Ollen Gross. Vernell Morgans, M.D. DATE OF BIRTH:  1941-03-13   DATE OF CONSULTATION:  04/17/2009  DATE OF DISCHARGE:                                 CONSULTATION   REQUESTING PHYSICIAN:  Anselmo Rod, MD   SURGEON:  Ollen Gross. Vernell Morgans, MD   REASON FOR CONSULTATION:  Gastric outlet obstruction.   HISTORY OF PRESENT ILLNESS:  [name] is a 70 year old female patient  with chronic recurrent pyloric stenosis and strictures.  She has  undergone multiple medical therapies for this including prior dilatation  procedures.  She has had recurrence of symptoms recently.  At this time,  she presented with hematemesis.  EGD was done by Dr. Loreta Ave today that  showed severe erosive esophagitis as well as nodular changes in the  antrum mucosa and this area was biopsied.  She also has a pinhole  opening in her pylorus consistent with gastric outlet obstruction.  Surgical consultation has been requested.   REVIEW OF SYSTEMS:  Per the history present illness.  Basically from a  GI standpoint, the patient has been complaining of chronic burning and  pain in the throat, especially after eating.  This has been worse over  the past month.   PAST MEDICAL HISTORY:  1. Diabetes mellitus.  2. GERD.  3. Pyloric stricture and peptic ulcer disease.  4. Chronic back pain.  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  6. Reported chronic malaria in 1970s.   PAST SURGICAL HISTORY:  1. Two back surgeries in [place].  2. Status post exploratory lap with what has been described by the      patient as an ileocecectomy and appendectomy after she developed      frank peritonitis due to injury from the second back surgery.   SOCIAL HISTORY:  She is married.  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  She does  not use tobacco or alcohol products.   FAMILY HISTORY:  Noncontributory.   ALLERGIES:  POLYMYXIN B and GENTAMICIN EYE DROPS.   CURRENT MEDICATIONS AT HOME:  She was on fentanyl, clonazepam,  venlafaxine, alprazolam, Flexeril, Tylenol No. 3 with Codeine,  gabapentin, verapamil, albuterol inhalers, lisinopril, metformin, and  simvastatin.   PHYSICAL EXAMINATION:  GENERAL:  A pleasant female patient, currently  reporting improvement in symptoms, but she has been n.p.o.  She is  status post EEG.  Husband is at the bedside.  VITAL SIGNS:  Temperature 98.6, BP 105/60, pulse 56 and regular, and  respirations 20.  PSYCH:  The patient is alert and oriented x3.  Her affect is appropriate  to current situation.  NEURO:  Cranial nerves II-XII are grossly intact.  She is moving all  extremities x4 without focal neurological deficits.  EYES:  Sclerae are mildly injected, nonicteric bilaterally.  EARS, NOSE, AND THROAT:  Ears are symmetrical.  No otorrhea.  Nose is  midline.  No rhinorrhea.  Oral mucous membranes are pink and moist.  CHEST:  Bilateral lung sounds, clear to auscultation anteriorly.  Respiratory effort is nonlabored.  She is on room air.  CARDIOVASCULAR:  Heart sounds S1 and S2.  No rubs, murmurs, or gallops.  Slightly bradycardic to auscultation.  No peripheral edema.  No JVD.  ABDOMEN:  Obese, but soft, nontender, and nondistended.  She is a well-  healed midline scar from previous described surgery.  No obvious  hepatosplenomegaly.  EXTREMITIES:  Symmetrical in appearance with soft nonpitting edema.  They are warm to the touch.  Pulses are palpable.   LABORATORY DATA:  Sodium 139, potassium 3.2, CO2 of 27, glucose 98, BUN  7, and creatinine 0.8.  White count is 9300, was 12,400 upon  presentation; hemoglobin 11.6; and platelets 230,000.  PTT 24, PT 14.7,  and INR 1.1.   DIAGNOSTICS:  CT of the abdomen and pelvis done yesterday essentially  stable, prior soft tissue fullness  in the posterior aspect of the  proximal stomach compared to previous CTs, it has now resolved.   IMPRESSION:  1. 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  2. Erosive esophagitis secondary to above gastric outlet obstruction.  3. Diabetes.   PLAN:  1. GI has started clear liquid diet.  We would continue this for now,      would not advance.  2. The patient will need operative intervention this admission.  Need      to follow up on pathology to determine appropriateness of      procedure.  Procedure is dependent on pathology findings, but      likely will involve a partial gastrectomy with a possible      gastrojejunostomy procedure.  I will defer this to the MD.      Revonda Standard L. Rennis Harding, N.POllen Gross. Vernell Morgans, M.D.  Electronically Signed   ALE/MEDQ  D:  04/17/2009  T:  04/18/2009  Job:  [id number]   cc:   Anselmo Rod, M.D.

## 2011-03-03 NOTE — H&P (Signed)
NAMELOTTA, FRANKENFIELD NO.:  [id number]   MEDICAL RECORD NO.:  [id number]          PATIENT TYPE:  INP   LOCATION:  3737                         FACILITY:  [facility]   PHYSICIAN:  Zannie Cove, MD     DATE OF BIRTH:  [birth year]   DATE OF ADMISSION:  04/15/2009  DATE OF DISCHARGE:                              HISTORY & PHYSICAL   PRIMARY CARE PHYSICIAN:  Reuben Likes, MD   GI PHYSICIAN:  Anselmo Rod, MD   CHIEF COMPLAINT:  Blood in vomitus.   HISTORY OF PRESENT ILLNESS:  Ms. Console is a 70 year old female who  presents to the ED with complaints of nausea and vomiting for the past 6  days.  The patient reports that she has been throwing up essentially  everything, unable to keep even liquids down for the past 5 days.  Noted  to have streaks of blood in the vomitus last night and today had more  pronounced bright red blood in the vomitus.  Denies any melena.  Denies  any NSAIDs.  No new medications.  No fevers or chills.  Also reports  diffuse abdominal pain for the last 4-5 days.   PAST MEDICAL HISTORY:  Diabetes, hypertension, pyloric stricture status  post balloon dilatation in 2008, peptic ulcer disease, chronic back  pain, and dyslipidemia.   PAST SURGICAL HISTORY:  Exploratory laparotomy and back surgery.   MEDICATIONS:  1. Fentanyl 25 mcg q.72 h.  2. Clonazepam 0.5 b.i.d.  3. Venlafaxine 37.5 mg b.i.d.  4. Alprazolam 0.5 mg at bedtime.  5. Flexeril 10 mg 3 times a day as needed.  6. Tylenol No. 3 with Codeine 3 times a day as needed.  7. Gabapentin 100 mg 3 times a day.  8. Verapamil sustained release 240 mg b.i.d.  9. Albuterol inhaler 2 puffs 4 times a day.  10.Lisinopril 10 mg once a day.  11.Metformin 500 mg b.i.d.  12.Simvastatin 80 mg at bedtime.  13.Estrace vaginal cream as needed.  14.Vitamin D 50,000 units every week.   ALLERGIES:  POLYMYXIN B.   SOCIAL HISTORY:  Lives at home with her husband.  She is a former  smoker, quit in  1991.  Drinks 1-2 beers per day.   FAMILY HISTORY:  Significant for coronary artery disease.   REVIEW OF SYSTEMS:  Twelve systems reviewed, negative expect per HPI.   PHYSICAL EXAMINATION:  VITAL SIGNS:  Temp is 98.2, pulse is 74, blood  pressure 134/77, respirations 18, and sating 95% on 2 L.  GENERAL:  She is alert, awake, and oriented x3.  HEENT:  Pupils equal and reactive to light.  Extraocular movements  intact.  No pallor or icterus.  CARDIOVASCULAR:  S1 and S2.  Regular rate and rhythm.  LUNGS:  Clear to auscultation bilaterally.  ABDOMEN:  Soft and nontender.  Positive bowel sounds.  No organomegaly.  EXTREMITIES:  No edema, clubbing, or cyanosis.  RECTAL:  Heme-positive brown stool.   LABORATORY DATA:  Hemoglobin 15.2, hematocrit 50, white blood cells  14.7, 79% neutrophils, and platelets 263.  Chemistries:  Sodium 130,  potassium 2.5, chloride 82, bicarb 38, BUN 23, creatinine 1.6, glucose  187, and anion gap 0.9.  Coags pending.   ASSESSMENT AND PLAN:  63. A 70 year old female with upper gastrointestinal bleed likely      secondary to her peptic stricture and repeated vomitings, question      Mallory-Weiss tear from continuous vomiting versus peptic ulcer      disease.  Start IV Protonix 40 mg b.i.d., normal saline at 100 mL      an hour.  Check H and H q.8 h., type and cross and transfuse as      needed.  GI, Dr. Loreta Ave, already consulted by the ED.  We will also      check coags.  2. Acute renal failure and chronic kidney disease secondary to      dehydration.  We will hydrate with normal saline at 100 an hour.      We will hold her lisinopril.  Check I's and O's and chemistries.  3. Hypokalemia secondary to vomiting, replace.  4. Diabetes.  Hold metformin.  Put on ADA diet and sliding scale      insulin.  5. Chronic back pain.  Continue home meds.  6. Deep vein thrombosis prophylaxis with PAS stockings.  7. Code status.  The patient is a full code.      Zannie Cove, MD  Electronically Signed     PJ/MEDQ  D:  04/15/2009  T:  04/16/2009  Job:  [id number]

## 2011-03-03 NOTE — Op Note (Signed)
[name], [name] NO.:  [id number]   MEDICAL RECORD NO.:  [id number]          PATIENT TYPE:  INP   LOCATION:  3305                         FACILITY:  [facility]   PHYSICIAN:  Ollen Gross. Vernell Morgans, M.D. DATE OF BIRTH:  April 11, 1941   DATE OF PROCEDURE:  04/19/2009  DATE OF DISCHARGE:                               OPERATIVE REPORT   PREOPERATIVE DIAGNOSIS:  Chronic peptic ulcer disease causing gastric  outlet obstruction.   POSTOPERATIVE DIAGNOSIS:  Chronic peptic ulcer disease causing gastric  outlet obstruction.   PROCEDURE:  Antrectomy and vagotomy with a Billroth II, reconstruction,  anticolic.   SURGEON:  Ollen Gross. Vernell Morgans, MD   ASSISTANT:  Dr. Lindie Spruce.   ANESTHESIA:  General endotracheal.   PROCEDURE IN DETAIL:  After informed consent was obtained, the patient  was brought to the operating room and placed in the supine position on  the operating room table.  After induction of general anesthesia, the  patient's abdomen was prepped with Betadine and draped in usual sterile  manner.  An upper midline incision was made with a 10-blade knife.  This  incision was carried down through the skin and subcutaneous tissue  sharply with the electrocautery until the linea alba was identified.  The linea alba was incised with the electrocautery.  The preperitoneal  space then probed with hemostat until the peritoneum was opened.  Access  was gained to the abdominal cavity.  The rest of the incision was then  opened under direct vision.  The patient had some omental adhesions  inferiorly, which was taken down sharply with the electrocautery.  There  were a lot of interloop adhesions from her previous abdominal surgery.  We were able to free a lot of these filmy adhesions out by combination  of blunt and finger dissection and some sharp dissection with the  electrocautery and Metzenbaum scissors.  Doing this, we were able to  identify the ligament of Treitz and free up the  interloop adhesions in  the jejunal area, so that the bowel was completely free in the proximal  small bowel.  Next, the stomach was identified.  The dissection was  carried out on the greater curve just outside the epiploic vessels.  Doing this, we were able to open this area by combination of sharp and  Bovie dissection and take some of this greater omentum down with the  LigaSure.  Doing this, we were able to enter the lesser sac.  We then  took some of this greater omentum and short gastrics down along the  greater curve.  We were able to identify the area where the epiploic  vessels came very close to the greater curve of the stomach to indicate  the extent of the antrum.  We opened up the lesser curve and the  hepaticogastric ligament as well.  This was done sharply with  electrocautery.  We then dissected out the pylorus by a combination of  blunt right angle dissection and taking some of this tissue down with  the LigaSure.  Once we were just distal to the pylorus,  we were able to  free the first part of the duodenum up, so that it was all clean with no  tissue surrounding it.  We then placed a GIA-75 stapler across this  proximal duodenum just distal to the pylorus, clamped and fired thereby  dividing the proximal duodenum between staple lines.  We then placed a  Bookwalter retractor, retracted the liver superiorly, and pulled down on  the stomach distally.  We were able to identify the esophagus by  palpation of the NG tube.  We then were able to identify both the  anterior and posterior vagus nerves.  We dissected these out bluntly  with a right angle.  We put a clip proximally and distally on the  anterior and posterior vagus nerve and divided with the Metzenbaum  scissors and we then sent these segments of nerve to pathology for  identification.  Next, we chose a portion of proximal jejunum that would  easily reach the stomach.  It appeared to nicely approximate the   posterior wall of the stomach.  We then held the posterior wall of the  stomach in the antimesenteric edge of the jejunum in apposition with 2-0  silk stitches.  We made an enterotomy on each with the electrocautery.  We then placed each limb of GIA-75 stapler down the appropriate limb of  either stomach or jejunum, clamped and fired thereby creating a nice  widely patent gastrojejunostomy .  We closed the common enterotomy with  a TA-60 stapler and then dunked the corners of the staple lines with  interrupted 2-0 silk Lembert stitches.  We also placed a crotch stitch  at the edge of the staple line as well with the 2-0 silk interrupted  stitch.  Once this was accomplished, the gastrojejunostomy was healthy  appearing and widely patent.  The small bowel sat nicely without any  kinks.  The proximal small bowel was to the patient's left.  The distal  small bowel was to the patient's right.  Everything else looked healthy.  We irrigated the abdomen with copious amounts of saline.  We then closed  the fascia of the anterior abdominal wall with two running #1 looped PDS  sutures.  The subcutaneous tissue was irrigated with copious amounts of  saline.  The skin was closed with staples.  Sterile dressings were  applied.  The patient tolerated the procedure well.  At the end of the  case, all needle, sponge, and instrument counts were correct.  The  patient was then awakened and taken to recovery room in stable  condition.      Ollen Gross. Vernell Morgans, M.D.  Electronically Signed     PST/MEDQ  D:  04/19/2009  T:  04/20/2009  Job:  [id number]

## 2011-03-03 NOTE — Op Note (Signed)
NAME:  [name], [name]                ACCOUNT NO.:  [id number]   MEDICAL RECORD NO.:  [id number]          PATIENT TYPE:  AMB   LOCATION:  ENDO                         FACILITY:  [facility]   PHYSICIAN:  Anselmo Rod, M.D.  DATE OF BIRTH:  Nov 23, 1940   DATE OF PROCEDURE:  06/08/2007  DATE OF DISCHARGE:                               OPERATIVE REPORT   PROCEDURE PERFORMED:  Esophagogastroduodenoscopy with balloon dilatation  of a high grade pyloric stricture.   ENDOSCOPIST:  Anselmo Rod, M.D.   INSTRUMENT USED:  Pentax video panendoscope.   INDICATIONS FOR PROCEDURE:  A 70 year old white female with a history of  ulceration around the pylorus undergoing a repeat EGD for dilatation of  pyloric stricture. The patient had significant narrowing on the previous  endoscopy and pediatric scope was used to maneuver through the pylorus.  The proximal small bowel at that time was visualized. The patient has a  history of iron deficiency anemia. Her iron supplements were held 5 days  prior to the procedure.   PREPROCEDURE PREPARATION:  Informed consent was procured from the  patient. The patient fasted for 8 hours prior to procedure. Risks and  benefits of the procedure including bleeding, perforation, etc. were  discussed with the patient in detail.   PREPROCEDURE PHYSICAL:  The patient had stable vital signs, neck supple.  Chest clear to auscultation.  S1, S2 regular.  Abdomen soft with normal  bowel sounds.   DESCRIPTION OF PROCEDURE:  The patient was placed in the left lateral  decubitus position and sedated with 125 mcg of Fentanyl and 10 mg of  Versed given intravenously in slow incremental doses. Once the patient  was adequately sedated, maintained on low-flow oxygen and continuous  cardiac monitoring, the Pentax video panendoscope was advanced through  the mouthpiece over the tongue into the esophagus under direct vision  and the vocal cords appeared healthy. The entire esophagus  widely patent  with no evidence of ring, stricture, mass, esophagitis or Barrett's  mucosa.  The scope was advanced into the stomach and some debris was  seen in the high fundus and cardia. The stomach seemed somewhat dilated  with a high grade stenosis at the pylorus.  In spite of repeated  maneuvers the adult upper endoscope could not be passed through the  pyloric stricture.  Using radial balloon dilator measuring 6, 7, and 8  mm the stricture was dilated very precariously and then the adult scope  was withdrawn as I could not pass it through the pylorus stricture.A  pediatric scope was used instead  and attempts were made to pass through  the pylorus and in spite of repeated maneuvers, I could not pass through  the pyloric area into the proximal small bowel and therefore the  pediatric scope was withdrawn and the adult scope inserted and the  stricture dilated with controlled radial expansion balloon measuring 8,  9 and 10 mm in a sequential manner. The balloons were held in place for  two minutes at the time and there was some bleeding at the site of  dilatation, mild ulceration  was noted around the pylorus as well. The  adult scope was then used to maneuver through the pylorus but it was not  possible. Therefore the adult scope was again removed and the pediatric  EGD scope passed which I was able to manipulate through the pylorus into  the proximal small bowel. There was some debris in the proximal small  bowel; however, no other stricture, erosions, ulcerations or masses were  seen. No definite outlet obstruction was appreciated.  Retroflexion in  the high cardia revealed some debris but no other abnormalities were  noted. The procedure was prolonged but the patient tolerated the  procedure well without complications. Four cold biopsies were done from  the pylorus to rule out malignancy.   IMPRESSION:  1. Normal appearing vocal cords and esophagus.  2. Some debris in the  stomach, especially along the fundus and high      cardia.  3. 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  4. Normal proximal small bowel.   RECOMMENDATIONS:  1. Increase Aciphex to 20 mg BID.  2. Carafate suspension has been called into the patient's pharmacy.      She is to take this 1 gram between meals and at bedtime.  3. She is to avoid the use of all nonsteroidals including aspirin for      now.  4. Outpatient follow-up within the next week for further      recommendations and repeat CBC with regard to her iron deficiency      anemia.      Anselmo Rod, M.D.  Electronically Signed     JNM/MEDQ  D:  06/08/2007  T:  06/09/2007  Job:  [id number]   cc:   Reuben Likes, M.D.  Fax: [phone]

## 2011-03-03 NOTE — Op Note (Signed)
[name], [name] NO.:  [id number]   MEDICAL RECORD NO.:  [id number]          PATIENT TYPE:  INP   LOCATION:  3737                         FACILITY:  [facility]   PHYSICIAN:  Anselmo Rod, M.D.  DATE OF BIRTH:  11/18/1940   DATE OF PROCEDURE:  04/17/2009  DATE OF DISCHARGE:                               OPERATIVE REPORT   PROCEDURE PERFORMED:  Esophagogastroduodenoscopy with antral biopsies.   ENDOSCOPIST:  Anselmo Rod.   INSTRUMENT USED:  Pentax video pan endoscope.   INDICATIONS FOR PROCEDURE:  A 70 year old white female with a 1-week  history of nausea, vomiting, hematemesis, undergoing a repeat EGD for  evaluation of her symptoms.  The patient had  a drop in her hemoglobin  to 11.4 gm/dL and has a history of pyloric stenosis, dilated with the  balloon and the past.  Rule out recurrent gastric outlet obstruction,  esophagitis, etc..   PREPARATION:  Informed consent was procured from the patient.  The  patient fasted for 8 hours prior to the procedure.  Risks and benefits  of the procedure were discussed with the patient in great detail.   PRE-PROCEDURE PHYSICAL EXAMINATION:  The patient had stable vital signs.  NECK:  Supple.  CHEST:  Clear to auscultation.  S1 and S2 regular.  ABDOMEN:  Soft with normal bowel sounds.   DESCRIPTION OF PROCEDURE:  The patient was placed in the left lateral  decubitus position and sedated with 100 mcg of Fentanyl and 10 mg of  Versed given intravenously in slow incremental doses.  Once the patient  was adequately sedated and maintained on low-flow oxygen and continuous  cardiac monitoring, the Pentax video panendoscope was advanced through  the mouthpiece over the tongue, into the esophagus under direct vision.  There was grade 4 distal esophagitis noted in the distal 5 cm of the  esophagus, and no esophageal stricture was appreciated.  The scope was  then advanced into the stomach.  There was some debris in the  high  cardia. Significant pyloric stenosis was noted.  The pylorus appeared to  be a small pinhole, and I did not make an attempt to pass the dilator or  biopsy forceps through this area for obvious reasons.  The antral mucosa  seemed somewhat nodular, and heaped-up, and biopsies were done for  pathology to rule out malignancy.  Retroflexion in the high cardia  revealed no other abnormalities except for some debris mentioned above.   IMPRESSION:  1. Grade 4 distal esophagitis.  2. Nodular changes in the antrum.  Biopsies done, results pending.  3. 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   RECOMMENDATIONS:  1. Await pathology results.  2. Surgical evaluation to be done by Dr. P..J. Carolynne Edouard [Surgical      service].  3. Clear liquid diet for now.  4. Continue serial CBCs.  5. Continue IV PPI.  6. Further recommendations were made by the surgical service.      Anselmo Rod, M.D.  Electronically Signed  JNM/MEDQ  D:  04/18/2009  T:  04/18/2009  Job:  [id number]   cc:   Reuben Likes, M.D.  Ollen Gross. Vernell Morgans, M.D.

## 2011-03-03 NOTE — Group Therapy Note (Signed)
[name], [name] NO.:  [id number]   MEDICAL RECORD NO.:  [id number]          PATIENT TYPE:  INP   LOCATION:  5151                         FACILITY:  [facility]   PHYSICIAN:  Elliot Cousin, M.D.    DATE OF BIRTH:  02-02-1941                                 PROGRESS NOTE   DATE OF DISCHARGE:  To be determined.   CURRENT DIAGNOSES AND PROBLEM LIST:  1. Nausea, vomiting, hematemesis and abdominal pain, secondary to      below.  2. Grade 4 distal esophagitis, nodular changes in the antrum, and high      grade pyloric stenosis per EGD by Dr. Charna Elizabeth on April 17, 2009.      Biopsies were taken and the pathology report revealed benign      gastric antral mucosa with minimal focal chronic inflammation and      rare H. pylori organisms identified.  (Dr. Loreta Ave will treat the      patient for Helicobacter pylori in the outpatient setting).  3. Antrectomy and vagotomy with Billroth II reconstruction on April 19, 2009, by Dr. Chevis Pretty.  4. Postoperative leukocytosis.  Resolving without antibiotic treatment      currently.  5. Hypomagnesemia and hypokalemia.  6. Acute renal insufficiency.  Now resolved.  The patient's creatinine      is now 0.64 and her BUN is less than 6.  7. Hypertension.  8. Type 2 diabetes mellitus.  9. Postoperative and macrocytic anemia.  10.Chronic anxiety.  11.Chronic low back pain with radiculopathy, treated with chronic      opioids.   CONSULTATIONS:  1. Bernerd Pho, MD.  2. Chevis Pretty, MD.  3. Dr. Billey Chang colleagues, including Dr. Jamey Ripa, also provided surgical      assistance.   PROCEDURES PERFORMED:  1. Antrectomy, vagotomy with Billroth II reconstruction by Dr. Carolynne Edouard on      April 19, 2009.  2. EGD performed by Dr. Charna Elizabeth on April 17, 2009.  The results are      above.  3. CT scan of the abdomen and pelvis with contrast on April 16, 2009.      The results revealed no acute abdominal process.  Resolved fatty      infiltration of  the liver.  Prominent abdominal lymph nodes, likely      reactive.  No acute pelvic process.  Bladder distention with a      suggestion of mild bladder dome wall thickening.   HISTORY OF PRESENT ILLNESS:  The patient is a 70 year old woman with a past medical history  significant for peptic ulcer disease, previous pyloric stricture,  chronic low back pain, and chronic anxiety.  She presented to the  emergency department on April 15, 2009, with a chief complaint of nausea,  vomiting and bloody emesis.  When she was evaluated in the emergency  department, she was noted to be hemodynamically stable.  Her hemoglobin  was 15.2.  Her BUN was 23 and her creatinine was 1.6.  Her venous  glucose was 187.  Her white blood cell  count was slightly elevated at  14.7.  The patient was admitted for further evaluation and management.   For additional details, please see the dictated history and physical.   HOSPITAL COURSE:  1. CHRONIC PEPTIC ULCER DISEASE CAUSING GASTRIC OUTLET OBSTRUCTION,      GRADE 4 DISTAL ESOPHAGITIS, NODULAR CHANGES IN THE ANTRUM, AND H.      PYLORI POSITIVE.  The patient was started on IV fluid volume      repletion with normal saline.  Her hemoglobin and hematocrit were      followed closely.  She was typed and crossed 2 units of packed red      blood cells; however, she did not require transfusions.  She was      started on Protonix 40 mg IV twice daily.  The initial evaluation      started with a CT scan of the abdomen and pelvis and the results      were essentially unremarkable.  Subsequently, Dr. Charna Elizabeth was      consulted.  Following Dr. Kenna Gilbert assessment, she performed an EGD      on April 17, 2009.  The results of the EGD are noted above.      Biopsies were taken by Dr. Loreta Ave and the pathology results are noted      above.  Per Dr. Loreta Ave, the patient will be treated for H. pylori in      the outpatient setting.  Given the findings of high-grade pyloric      stenosis  on the EGD, Dr. Loreta Ave consulted general surgeon Dr. Carolynne Edouard.      He evaluated the patient on April 17, 2009 and advised operative      intervention.  Per his assessment, the patient would likely need a      partial gastrectomy and a possible gastrojejunostomy procedure.  On      April 19, 2009, Dr. Carolynne Edouard performed an antrectomy, vagotomy, and      Billroth II reconstruction.  Following the operation, an NG tube      was placed.  It is currently draining bilious fluid.  The patient's      pain is being treated with PCA morphine.  She is being maintained      on proton pump inhibitor therapy intravenously.  She is still      n.p.o.  Dr. Jamey Ripa provided the follow-up general surgery      assessment today and he ordered a KUB to assess the NG tube      placement.  He also ordered a Dulcolax suppository today as the      patient has had no bowel sounds and no bowel movements since the      operation.  She is currently postoperative day #3.  2. HYPERTENSION.  The patient is treated chronically with verapamil      and lisinopril.  Now that she is n.p.o., these medications cannot      be given orally.  Her blood pressure has increased over the past 48-      72 hours.  Therefore, intravenous Lopressor has been added at 5 mg      IV every 4 hours and Catapres patch was started yesterday at 0.2 mg      every 7 days.  3. TYPE 2 DIABETES MELLITUS.  The patient is treated chronically with      metformin.  Metformin was discontinued because of her n.p.o. status      and sliding scale  NovoLog and Lantus were added.  Over the past      several days, her capillary blood glucose has been well controlled.      Her hemoglobin A1c was 5.7.  4. HYPOMAGNESEMIA AND HYPOKALEMIA.  The patient's serum potassium fell      to a nadir of 3.1.  She was started on potassium chloride repletion      initially orally and then intravenously.  A blood magnesium level      was assessed and it was found to be quite low at 0.8.  She  was      repleted with magnesium sulfate with a total of 4 grams.  Following      the magnesium sulfate replacement, her blood magnesium improved to      2.4.  However, as of today, April 22, 2009, it has decreased again to      1.2.  It is possible that the patient may have renal wasting of      magnesium, although this has not been confirmed.  Her serum      potassium is 3.6 today.  She will be given another 2 grams of      magnesium sulfate intravenously today and her blood magnesium will      be monitored closely.  5. POSTOPERATIVE LEUKOCYTOSIS.  Prior to the operation, the patient's      WBC was 8.1.  Following the operation, it increased to 19.3.  Over      the past 48 hours, it has improved gradually to 12.3 without      antibiotic treatment.  Her white blood cell count will continue to      be monitored.  6. POSTOPERATIVE ANEMIA ALONG WITH MACROCYTIC ANEMIA.  The patient's      hemoglobin was 15.4 at the time of the initial hospital assessment,      which may have been secondary to hemoconcentration.  Following IV      fluid volume repletion, her hemoglobin fell to 11.6.  Following the      operation, it fell to 10.9.  It was also noted that her MCV ranged      from 102-103.  Vitamin B12 and folate levels will be ordered today      to rule out deficiency.  She does not require transfusion of packed      red blood cells at this point.  Her hemoglobin and hematocrit will      continue to be monitored.  7. CHRONIC LOW BACK PAIN AND CHRONIC ANXIETY.  The patient is treated      chronically with fentanyl/Duragesic, clonazepam, Effexor,      alprazolam, and gabapentin.  Because she is n.p.o., she is being      treated with a morphine PCA and b.i.d. dosing of Ativan      intravenously.  The fentanyl patch was discontinued as the patient      apparently became confused and difficult to arouse following the      operation.  It has remained off for now.  When the morphine PCA      pump is  discontinued, the fentanyl patch can probably be added back      safely.   DISCHARGE DISPOSITION:  The patient is not currently ready for hospital discharge yet.  She has  no active bowel sounds and the NG tube is still draining quite a bit of  bilious fluids.  She is currently n.p.o. and hemodynamically stable  although she  is hypertensive.  Her capillary blood glucose has been  stable and controlled.  She is being repleted with magnesium sulfate  once again today.      Elliot Cousin, M.D.  Electronically Signed     DF/MEDQ  D:  04/22/2009  T:  04/22/2009  Job:  [id number]   cc:   Anselmo Rod, M.D.  Fax: [phone]   Reuben Likes, M.D.  Fax: [phone]   Ollen Gross. Vernell Morgans, M.D.  [address], [address]  [place]  [place] [address]

## 2011-03-06 NOTE — H&P (Signed)
NAME:  Sheri, Calhoun NO.:  [id number]   MEDICAL RECORD NO.:  [id number]           PATIENT TYPE:   LOCATION:                                 FACILITY:   PHYSICIAN:  Vesta Mixer, M.D. DATE OF BIRTH:  [birth year]   DATE OF ADMISSION:  01/28/2007  DATE OF DISCHARGE:                              HISTORY & PHYSICAL   [name] is an elderly female with multiple risk factors for  coronary artery disease.  She is admitted for heart catheterization  after having an abnormal stress Cardiolite study.   [name] is an elderly female with a history of hyperlipidemia,  hypertension, diabetes mellitus, and family history of heart of coronary  artery disease.  She has been having some episodes of chest pain.  These  episodes of chest pain are somewhat atypical.  They are typically left-  sided chest pains.  They typically occur at rest and radiates through to  her back.  She describes also some chest pressure.  They do not seem to  radiate.  She does not do any regular exercise.  She has not noticed any  associated with any association with eating, drinking, change position  or taking a deep breath.   CURRENT MEDICATIONS:  1. Metformin 500 mg p.o. b.i.d.  2. Gabapentin three times a day.  3. Simvastatin 80 mg a day.  4. Metoclopramide 5 mg four times a day.  5. Verapamil 240 mg twice a day.  6. Bumex 1 mg a day.  7. Paroxetine 48 mg a day.  8. Clonazepam 0.5 mg twice a day.  9. Cyclobenzaprine 10 mg three times a day.  10.Acetaminophen with codeine 30 mg four times a day.  11.Fosamax 70 mg once a week.  12.Calcium once a day.  13.Glucosamine/chondroitin once a day.  14.Horse chestnut once a day.  15.Lutein once a day.   ALLERGIES:  She is allergic POLYMYXIN.   PAST MEDICAL HISTORY:  1. Hypertension.  2. Diabetes mellitus.  3. Hyperlipidemia.   SOCIAL HISTORY:  The patient smoked until 1991.   FAMILY HISTORY:  Positive for coronary artery disease.   REVIEW OF SYSTEMS:  Reviewed and is essentially negative, except for as  noted in the HPI.   PHYSICAL EXAM:  GENERAL:  She is an elderly female in no acute distress.  She is alert and oriented x3.  Mood and affect are normal.  VITAL SIGNS:  Weight is 175 which is up 2 pounds.  Blood pressure is  120/70 with a heart rate of 72.  HEENT EXAM:  Reveals pupils __________ .  NECK:  Carotids:  She has no bruits.  No JVD.  No thyromegaly.  LUNGS:  Clear to auscultation.  HEART:  Regular rate, S1, S2.  ABDOMINAL EXAM:  Reveals good bowel sounds and is nontender.  EXTREMITIES:  She has no clubbing, cyanosis or edema.  NEURO EXAM:  Nonfocal.   Her stress Cardiolite study reveals mild anteroapical ischemia.  There  definitely appears to be a reversible component to this pattern.   [name] presents with multiple risk factors for coronary  artery disease  as well as episodes of chest pain and an abnormal stress Cardiolite  study.  I think it would be in her best interest to proceed with heart  catheterization.  We have discussed the risks, benefits and options of  heart catheterization.  She understands and agrees to proceed.  All of  her other medical problems remain fairly stable at this time.           ______________________________  Vesta Mixer, M.D.     PJN/MEDQ  D:  01/30/2007  T:  01/31/2007  Job:  [id number]   cc:   Reuben Likes, M.D.

## 2011-03-06 NOTE — Cardiovascular Report (Signed)
NAME:  [name], [name] NO.:  [id number]   MEDICAL RECORD NO.:  [id number]          PATIENT TYPE:  OIB   LOCATION:  1962                         FACILITY:  [facility]   PHYSICIAN:  Vesta Mixer, M.D. DATE OF BIRTH:  1941-10-10   DATE OF PROCEDURE:  02/04/2007  DATE OF DISCHARGE:                            CARDIAC CATHETERIZATION   [name] is a 70 year old female with multiple risk factors for  coronary artery disease including hypertension, diabetes,  hypercholesterolemia and a family history of coronary artery disease.  She has had some episodes of chest pain.  She recently had a stress  Cardiolite study which revealed some evidence of anteroapical ischemia.  She is referred for a heart catheterization for further evaluation.   PROCEDURES:  Left heart catheterization with coronary angiography.   The right femoral artery was easily cannulated, using the modified  Seldinger technique.   HEMODYNAMIC RESULTS:  LV pressure was 131/25, with an aortic pressure of  131/61.   ANGIOGRAPHY:  The left main and proximal LAD and proximal circumflex  artery are all moderately calcified.   The left main is otherwise normal.  There is no discrete stenosis.   The left anterior descending artery is proximal in the normal segment.  There is a 30% stenosis just after giving off the first septal branch.  The LAD then has minor luminal irregularities.   The first diagonal artery is fairly normal.   Left circumflex artery is unremarkable.  There are minor luminal  irregularities.   The right coronary artery has mild to moderate calcification throughout  the vessel.  There is a mild to moderate irregularity in the mid-  segment with a stenosis of about 20-30%.   The left ventriculogram was performed in the 30 RAO position.  It  reveals overall normal left ventricular systolic function.  The ejection  fraction is approximately 50%.  There was mitral regurgitation, but it  is  thought to be probably due to the catheter.   COMPLICATIONS:  None.   CONCLUSION:  1. Mild coronary artery irregularities with mild to moderate      calcification.  She has no obstructive lesions that would require      surgery at this point.  2. Overall normal left ventricular systolic function (lower end of      normal).  She does have elevated diastolic pressures and evidence      of diastolic dysfunction.  We will continue with aggressive medical      therapy and will address her diastolic dysfunction.           ______________________________  Vesta Mixer, M.D.     PJN/MEDQ  D:  02/04/2007  T:  02/04/2007  Job:  [id number]   cc:   Reuben Likes, M.D.

## 2011-03-06 NOTE — Op Note (Signed)
NAME:  [name], [name]                ACCOUNT NO.:  [id number]   MEDICAL RECORD NO.:  [id number]          PATIENT TYPE:  AMB   LOCATION:  ENDO                         FACILITY:  [facility]   PHYSICIAN:  Anselmo Rod, M.D.  DATE OF BIRTH:  09-13-1941   DATE OF PROCEDURE:  03/18/2005  DATE OF DISCHARGE:                                 OPERATIVE REPORT   PROCEDURE PERFORMED:  Screening colonoscopy.   ENDOSCOPIST:  Anselmo Rod, M.D.   INSTRUMENT USED:  Video colonoscope.   INDICATIONS FOR THE PROCEDURE:  [age]-year-old white female with a  history of change in bowel habits and is having a screening colonoscopy to  rule out chronic polyps, masses, etc.   PREPROCEDURE PREPARATION:  Informed consent was procured from the patient.  The patient was __________  procedure and prepped with a bottle of magnesium  citrate and a gallon of GoLYTELY the night prior to the procedure.  The  risks and benefits of the procedure including the 10% missed rates of cancer  and polyps was discussed with the patient as well.   PREPROCEDURE PHYSICAL EXAMINATION:  VITAL SIGNS:  The patient has stable  vital signs.  NECK:  The neck is supple.  CHEST:  The chest is clear to auscultation.  HEART:  The heart is regular.  ABDOMEN:  The abdomen is soft with normal bowel sounds.   DESCRIPTION OF THE PROCEDURE:  The patient was placed in the left lateral  decubitus position.  She was sedated with 100 mg Demerol and 10 mg of versed  along with 12.5 mg of Phenergan for possible nausea associated with  sedation.  Once the patient was adequately sedated, and maintained low-flow  oxygen and continuous cardiac monitoring the Olympus video colonoscope was  advanced in the rectum and to the cecum.  The appendiceal orifice and the  cecal valve were clearly visualized and photographed.  No masses, polyps,  erosions, ulcerations or diverticula were seen.  Small internal hemorrhoid  as seen on retroflexion.  The rectal  exam was unremarkable.   The patient tolerated the procedure well without complications.   IMPRESSION:  1.  Normal colonoscopy, except for small internal hemorrhoids seen on      retroflexion.  2.  No masses, polyps or diverticula seen.   RECOMMENDATIONS:  1.  Continue a high fiber diet with increased fluid intake.  2.  Repeat colonoscopy in the next 10 years unless the patient develops any      abnormal symptoms in the interim.  3.  Outpatient follow up as the need arises in the future.      JNM/MEDQ  D:  03/18/2005  T:  03/19/2005  Job:  [id number]   cc:   Reuben Likes, M.D.  [address]  [place]  [place] [address]  Fax: [phone]

## 2011-03-31 ENCOUNTER — Ambulatory Visit: Payer: Medicare Other | Admitting: Physical Medicine & Rehabilitation

## 2011-04-20 ENCOUNTER — Encounter: Payer: Medicare Other | Attending: Physical Medicine & Rehabilitation

## 2011-04-20 ENCOUNTER — Ambulatory Visit: Payer: Medicare Other | Admitting: Physical Medicine & Rehabilitation

## 2011-04-20 DIAGNOSIS — M545 Low back pain, unspecified: Secondary | ICD-10-CM | POA: Insufficient documentation

## 2011-04-20 DIAGNOSIS — M47817 Spondylosis without myelopathy or radiculopathy, lumbosacral region: Secondary | ICD-10-CM

## 2011-04-20 DIAGNOSIS — M961 Postlaminectomy syndrome, not elsewhere classified: Secondary | ICD-10-CM

## 2011-04-21 NOTE — Assessment & Plan Note (Signed)
REASON FOR VISIT:  Back pain.  HISTORY:  The patient is 7 weeks post left L2, 3, 4 medial branch block, L5 radiofrequency enterotomy under fluoroscopic guidance.  She has done well with this.  Overall pain level is 2-3/10 range.  She is doing a lot of gardening in her backyard.  Her pain is down from a 6 on average back in February which is prior to the radiofrequency procedure.  She has a right-sided procedure done on January 29, 2011.  Her pain is well relieved by her hydrocodone, she does not think she can get away from taking it but occasionally she does skip a dose.  In all she is doing much better. She can walk for an hour at a time now which is improved.  Oswestry is 38% which compares with an Oswestry score 50% prior to the procedure.  She has some numbness in her feet.  She relates this to her neuropathy. She is coughing, poor appetite, and review of systems otherwise negative.  Blood pressure is elevated today 183/96, but the repeated with manual cuff 146/100.  I instructed her to take it again at home and if it is still over 90 on the diastolic and 140 on the systolic to call her primary physician on this.  She has had no other new medical problems in the interval time.  Her back has minor tenderness to palpation in the PSIS area.  She has no pain in the hips, external and internal rotation are normal, straight leg raising test is negative.  Lower extremity strength is normal.  IMPRESSION:  Lumbar spondylosis.  She does have some mild residual pain which is likely related to her L3-4 degenerative disk.  She definitely does not have any radicular symptomatology.  I will see her back in another 3 months.  I told her we could first predict how long the radiofrequency procedure will last her.  She will follow up with her primary physician on her blood pressure.  Discussed with the patient and husband, agree with plan.     Erick Colace, M.D. Electronically  Signed    AEK/MedQ D:  04/20/2011 14:22:28  T:  04/20/2011 15:19:46  Job #:  [id number]

## 2011-07-03 ENCOUNTER — Ambulatory Visit: Payer: Medicare Other | Admitting: Physical Medicine & Rehabilitation

## 2011-07-03 ENCOUNTER — Encounter: Payer: Medicare Other | Attending: Physical Medicine & Rehabilitation

## 2011-07-03 DIAGNOSIS — M545 Low back pain, unspecified: Secondary | ICD-10-CM | POA: Insufficient documentation

## 2011-07-03 DIAGNOSIS — M47817 Spondylosis without myelopathy or radiculopathy, lumbosacral region: Secondary | ICD-10-CM

## 2011-07-03 NOTE — Assessment & Plan Note (Signed)
REASON FOR VISIT:  Increasing low back pain.  HISTORY:  A 70 year old female with lumbar spondylosis.  She also has lumbar post laminectomy syndrome.  She has had positive response to medial branch blocks and then she had a positive response to radiofrequency enterotomy.  This first time in couple of years that she is actually ended the garden.  Unfortunately, she feels like her back pain is increasing again before her radiofrequency enterotomy she was at a 5-6/10 pain on average.  Afterwards, she was in the 2-3/10 range and now she has crept up to about the 4/10 range.  She has had no radicular symptoms in the lower extremities.  She has had no bowel or bladder incontinence.  REVIEW OF SYSTEMS:  Positive for some trouble walking, depression, and anxiety.  She takes Neurontin for nerve related pain in the lower extremities and this has been helpful.  Pain is worse with walking, bending, and standing.  PHYSICAL EXAMINATION:  VITAL SIGNS:  Blood pressure 167/83, pulse 99, respirations 16, and O2 sat 100% on room air. GENERAL:  This is a well-developed, well-nourished female in no acute distress.  Her back has some tenderness at L5-S1 area.  She has pain with extension greater than flexion.  Straight leg raising test is negative.  Strength is normal in lower extremities.  Her lumbar spine range of motion is 100% with forward flexion but only 50% extension. Pain was with extension.  IMPRESSION: 1. Lumbar spondylosis without myelopathy. 2. Lumbar post laminectomy syndrome, chronic low radicular-type     symptomatology.  PLAN: 1. We will repeat RF.  We will increase her heating time to 90     seconds. 2. Continue her hydrocodone she really just takes two every noon of     the 5 mg dosage.  She continues on the gabapentin 300 t.i.d.  I     discussed with the patient and husband, agree with plan.  We will     plan to do the right side next month and the left side for the  radiofrequency the following month.     Erick Colace, M.D. Electronically Signed    AEK/MedQ D:  07/03/2011 13:37:41  T:  07/03/2011 15:38:42  Job #:  [id number]

## 2011-07-14 ENCOUNTER — Ambulatory Visit: Payer: Medicare Other | Admitting: Physical Medicine & Rehabilitation

## 2011-07-30 ENCOUNTER — Encounter: Payer: Medicare Other | Admitting: Physical Medicine & Rehabilitation

## 2011-08-20 ENCOUNTER — Encounter: Payer: Medicare Other | Admitting: Physical Medicine & Rehabilitation

## 2011-09-07 ENCOUNTER — Encounter: Payer: Medicare Other | Attending: Physical Medicine & Rehabilitation

## 2011-09-07 ENCOUNTER — Encounter: Payer: Medicare Other | Admitting: Physical Medicine & Rehabilitation

## 2011-09-07 DIAGNOSIS — M545 Low back pain, unspecified: Secondary | ICD-10-CM | POA: Insufficient documentation

## 2011-09-07 DIAGNOSIS — M47817 Spondylosis without myelopathy or radiculopathy, lumbosacral region: Secondary | ICD-10-CM

## 2011-09-07 NOTE — Procedures (Signed)
NAME:  [name], [name] NO.:  [id number]  MEDICAL RECORD NO.:  [id number]           PATIENT TYPE:  O  LOCATION:  [facility]                          FACILITY:  [facility]  PHYSICIAN:  Erick Colace, M.D.DATE OF BIRTH:  [birth year]  DATE OF PROCEDURE: DATE OF DISCHARGE:                              OPERATIVE REPORT  PROCEDURE:  This is radiofrequency neurotomy to denervate L3-4, L4-5, and L5-S1 facet joints.  INDICATIONS:  Lumbar spondylosis.  Pain is only partially response to medication management, other conservative care including narcotic analgesics, interferes with activities. Informed consent was obtained after describing risks and benefits of the procedure with the patient.  These include bleeding, bruising, and infection.  She elects to proceed and has given written consent.  The patient placed prone on fluoroscopy table.  Betadine prep, sterile drape, 25-gauge inch and half needle was used to anesthetize skin, subcu tissue, 1% lidocaine times each of 4 sites.  Then, a 20-gauge 10 cm RF needle with 10-mm curved active tip was inserted under fluoroscopic guidance targeting the right S1 SAP sacral junction, bone contact made, confirmed with lateral imaging.  Sensory stem at 50 Hz followed by motor stem at 2 Hz confirmed proper needle location, followed by injection of solution containing 1 mL of 4 mg/mL dexamethasone and 3 mL of 1% MPF lidocaine and radiofrequency lesioning 70 degrees Celsius for 90 seconds was performed.  Then, needles were inserted charting the left L3 and L4 SAP transverse process junction, bone contact made, confirmed with lateral imaging.  Sensory stem at 50 Hz followed by motor stem at 2 Hz, confirmed proper needle location, followed by injection 1 mL dexamethasone lidocaine solution and radiofrequency lesioning 70 degrees Celsius for 70 seconds.  Then, the right L5 SAP transverse process junction, targeted bone contact made, confirmed with  lateral imaging. Sensory stem did not confirm proper needle location.  There was some bleeding in that area and therefore that particular level was aborted to be done in approximately 2 weeks.  The patient tolerated procedure well.  Postprocedure instructions given. I will see her back to do the L4 medial branch, should she have some residual pain in 2 weeks, we will do the right side in approximately 1 month.     Erick Colace, M.D. Electronically Signed    AEK/MEDQ  D:  09/07/2011 14:29:44  T:  09/07/2011 20:34:26  Job:  [id number]

## 2011-09-24 ENCOUNTER — Encounter: Payer: Medicare Other | Admitting: Physical Medicine & Rehabilitation

## 2011-09-24 ENCOUNTER — Encounter: Payer: Medicare Other | Attending: Physical Medicine & Rehabilitation

## 2011-09-24 DIAGNOSIS — M47817 Spondylosis without myelopathy or radiculopathy, lumbosacral region: Secondary | ICD-10-CM | POA: Insufficient documentation

## 2011-09-24 DIAGNOSIS — M545 Low back pain, unspecified: Secondary | ICD-10-CM | POA: Insufficient documentation

## 2011-09-24 NOTE — Procedures (Signed)
NAME:  [name], [name] NO.:  [id number]  MEDICAL RECORD NO.:  [id number]           PATIENT TYPE:  O  LOCATION:  [facility]                          FACILITY:  [facility]  PHYSICIAN:  Erick Colace, M.D.DATE OF BIRTH:  [birth year]  DATE OF PROCEDURE:  09/24/2011 DATE OF DISCHARGE:                              OPERATIVE REPORT  PROCEDURE:  This is a right L4 medial branch radiofrequency neurotomy, left L2-3, L4 radiofrequency neurotomy, medial branch.  INDICATION:  Lumbar pain only partially response to medication management, other conservative care.  Increases with activity such as standing, when it goes up to about a 6/10.  Walking tolerance is only 30 minutes.  Informed consent was obtained after describing risks and benefits of the procedure with the patient.  These include bleeding, bruising, and infection.  She elects to proceed and has given written consent.  The patient placed prone on fluoroscopy table.  Betadine prep, sterile drape, proper procedure and the patient were confirmed.  On the right side,  anesthetized skin and subcu tissue, 1 mL of lidocaine x1 area and inserted the 20-gauge 10 cm RF needle to the right L5 SAP transverse process junction, bone contact made, confirmed with lateral imaging. Sensory stim at 50 Hz followed by motor stim at 2 Hz, confirmed proper needle location, followed by injection 1 mL of dexamethasone-lidocaine solution which comprised of 4 mg/mL dexamethasone x1 mL and 4 mL of 1% MPF lidocaine followed by radiofrequency lesioning at 80 degrees Celsius for 90 seconds.  Then, the left L3-L4 and L5 SAP transverse process junction targeted with a 20-gauge 10 cm RF needle, bone contact made in each case, and confirmed with lateral imaging.  Sensory stim at 50 Hz followed by motor stim at 2 Hz, confirmed proper needle location, followed by injection 1 mL of the dexamethasone-lidocaine solution and radiofrequency lesioning at 80 degrees  Celsius for 90 seconds.  The patient tolerated procedure well.  Postprocedure instructions given.     Erick Colace, M.D. Electronically Signed    AEK/MEDQ  D:  09/24/2011 10:08:41  T:  09/24/2011 11:31:32  Job:  [id number]

## 2011-09-30 ENCOUNTER — Other Ambulatory Visit: Payer: Self-pay | Admitting: Emergency Medicine

## 2011-10-08 ENCOUNTER — Ambulatory Visit
Admission: RE | Admit: 2011-10-08 | Discharge: 2011-10-08 | Disposition: A | Discharge status: Home / Self Care | Payer: Medicare Other | Source: Ambulatory Visit | Attending: Emergency Medicine | Admitting: Emergency Medicine

## 2011-11-24 ENCOUNTER — Ambulatory Visit: Payer: Medicare Other | Admitting: Physical Medicine & Rehabilitation

## 2011-11-24 ENCOUNTER — Encounter: Payer: Medicare Other | Attending: Physical Medicine & Rehabilitation

## 2011-11-24 DIAGNOSIS — M47817 Spondylosis without myelopathy or radiculopathy, lumbosacral region: Secondary | ICD-10-CM | POA: Insufficient documentation

## 2011-11-24 DIAGNOSIS — M545 Low back pain, unspecified: Secondary | ICD-10-CM | POA: Insufficient documentation

## 2011-11-24 NOTE — Assessment & Plan Note (Signed)
She underwent right L2-3-4 radiofrequency September 24, 2011.  Results have been okay, but not as good as before.  Her previous RF was performed January 29, 2011, on the right side.  She had an episode where she had some numbness in the left arm and left leg when she was standing at the sink lasted no more than 4 minutes.  She had no loss of function in her upper extremity.  She has had no prior episodes that are similar. She has been told by her cardiologist Dr. Elease Hashimoto that she should be taking a baby aspirin a day, but has not been doing so.  This has resolved.  No recurrence.  No gait difficulty.  She still has 4-6/10 pain, but overall she is walking 30 minutes at a time.  She can climb steps.  She needs help with certain meal prep, household duties and shopping, but otherwise independent.  She has some weakness, numbness, trouble walking, depression, poor appetite, blood sugar regulation problems.  She is a diabetic.  She does have a history of diabetic neuropathy.  She also has a history of heart disease.  Blood pressure 169/87, pulse 82, respirations 18 and O2 sat 98% on room air.  Height 5 feet, 4 inches, weight 115 pounds.  General, no acute distress.  Orientation x3.  Affect is alert, although she does have some mild problems getting up on the table, seems to go up with her wrong foot.  On neurologic evaluation, she has 5/5 strength in bilateral deltoid, biceps, triceps, grip, as well as hip flexion, knee extension, ankle dorsiflexion.  Her sensation is decreased bilateral feet compared to the knees consists with her diabetic neuropathy.  She has no sensory deficits in the upper extremities.  Her deep tendon reflexes are normal except at the ankles were there are absent.  She has cranial nerves II through XII intact.  Bilateral upper and lower extremity strength are normal.  IMPRESSION:  Gait shows no evidence of toe drag or knee instability. Romberg is  negative.  IMPRESSION: 1. Lumbar post laminectomy syndrome. 2. Lumbar spondylosis with some improvement after radiofrequency     neurotomy. 3. Episode of left facial, left upper extremity numbness.  Symptoms     worrisome for transient ischemic attack.  I have strongly     encouraged her to take her baby aspirin 1 a day and contact her     cardiologist.  She may need carotid Dopplers to further evaluate.     At this point, I have not really noted any weakness or any     neurologic deficits other than some sensory deficits from her     diabetic neuropathy.  She will see her cardiologist and primary     physician.  I will see her back in about 2 months.  We will     continue hydrocodone 5/325 one p.o. b.i.d. and gabapentin 300 mg     t.i.d.     Erick Colace, M.D. Electronically Signed    AEK/MedQ D:  11/24/2011 15:50:26  T:  11/24/2011 22:25:02  Job #:  [id number]  cc:   Vesta Mixer, M.D. Fax: [phone]

## 2011-11-27 ENCOUNTER — Ambulatory Visit: Payer: Medicare Other | Admitting: Cardiovascular Disease

## 2011-12-04 NOTE — Progress Notes (Signed)
Cannot close note. 

## 2011-12-10 ENCOUNTER — Ambulatory Visit: Payer: Medicare Other | Admitting: Cardiovascular Disease

## 2011-12-11 ENCOUNTER — Ambulatory Visit (INDEPENDENT_AMBULATORY_CARE_PROVIDER_SITE_OTHER): Payer: Medicare Other | Admitting: Internal Medicine

## 2011-12-11 VITALS — BP 183/94 | HR 79 | Temp 97.9°F | Resp 16 | Ht 65.0 in | Wt 151.0 lb

## 2011-12-11 DIAGNOSIS — H6690 Otitis media, unspecified, unspecified ear: Secondary | ICD-10-CM

## 2011-12-11 DIAGNOSIS — H6090 Unspecified otitis externa, unspecified ear: Secondary | ICD-10-CM

## 2011-12-11 MED ORDER — AMOXICILLIN 875 MG PO TABS: 875.0000 mg | ORAL_TABLET | Freq: Two times a day (BID) | ORAL | Status: AC

## 2011-12-11 MED ORDER — CIPROFLOXACIN-HYDROCORTISONE 0.2-1 % OT SUSP: 3.0000 [drp] | Freq: Two times a day (BID) | OTIC | Status: AC

## 2011-12-11 NOTE — Progress Notes (Signed)
  Subjective:    Patient ID: DESERI LOSS, female    DOB: 04-08-1941, 71 y.o.   MRN: [id number]  HPI Ladrea is the wife of a retired Social worker from [employer]. Who is doing well despite a number of medical problems. She was a patient of [facility] who has recently retired.  She has a history of pain in the left ear for the last 3 days and an upper Respiratory infection for the last 7-8 days.There is no history of fever. She has a mild sore throat   Review of Systems     Objective:   Physical Exam Vital signs reveal an elevated blood pressure but no fever The right TM is clear. The left TM is red and bulging and the canal is also red into inner third The nose and throat are clear and there are no anterior cervical meds       Assessment & Plan:   Problem #1 otitis media Problem #2 otitis externa  Amoxicillin and Cipro otic HC She'll set up an appointment with Dr. Audria Nine at [facility] for primary care. They plan to move to [place] once her house sells in the next few years.

## 2011-12-20 ENCOUNTER — Telehealth: Payer: Self-pay

## 2011-12-20 NOTE — Telephone Encounter (Signed)
HUSBAND CALLED, WIFE WAS SEEN BY DR DOOLITTLE LAST WEEK AND GIVEN ANTIBIOTICS.  DR TOLD THEM TO LET HIM KNOW IF SHE DID NOT GET BETTER.  [name] IS CALLING TO TELL us [name] IS NOT BETTER.  TOLD HIM WE WERE OPEN TIL 6PM TODAY, BUT HE WANTED ME TO PUT IN A MESSAGE FOR DR. Merla Riches.  [facility]

## 2011-12-21 ENCOUNTER — Ambulatory Visit (INDEPENDENT_AMBULATORY_CARE_PROVIDER_SITE_OTHER): Payer: Medicare Other | Admitting: Internal Medicine

## 2011-12-21 DIAGNOSIS — H908 Mixed conductive and sensorineural hearing loss, unspecified: Secondary | ICD-10-CM

## 2011-12-21 DIAGNOSIS — H9190 Unspecified hearing loss, unspecified ear: Secondary | ICD-10-CM

## 2011-12-21 DIAGNOSIS — H9192 Unspecified hearing loss, left ear: Secondary | ICD-10-CM

## 2011-12-21 NOTE — Progress Notes (Signed)
  Subjective:    Patient ID: [name], female    DOB: Jan 19, 1941, 71 y.o.   MRN: [id number]  HPI She finished Cortisporin otic and amoxicillin from her last visit which was an attempt to clear what was felt to be an otitis media with effusion and an otitis externa at the tympanic membrane She continues to hear the sound of her voice in her left ear and yet no longer has any pain. She notices decreased hearing on that side as well. She had a hearing test 18 months ago her last primary care office and a moderate loss was discussed but this has not affected her everyday life at this point.   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 Narda Bonds for a full evaluation

## 2011-12-25 ENCOUNTER — Ambulatory Visit: Payer: Medicare Other | Admitting: Family Medicine

## 2011-12-26 ENCOUNTER — Telehealth: Payer: Self-pay

## 2011-12-26 NOTE — Telephone Encounter (Signed)
Dr. Merla Riches referred patient to another doctor, and this doc prescribed Prednisone for ear infection. Patient had question about medication, had husband call and would like call back from [place].

## 2011-12-26 NOTE — Telephone Encounter (Signed)
PT IS NEEDING TO TALK WITH DR Merla Riches ABOUT THE REFERRAL SHE IS GOING TO

## 2011-12-28 NOTE — Telephone Encounter (Signed)
Spoke with patients husband.  He states they saw ENT and pt was placed on Doxy and Prednisone 6 day taper.  He was unsure of whether she should take this due to her currently having a fungal infection and being diabetic.  After speaking with their dentist today, he was reassured that this was appropriate for a short term treatment of Prednisone.  I advised since wife is diabetic to continue to monitor sugars closely on rx.  Note to Dr. Merla Riches, [place].

## 2011-12-31 ENCOUNTER — Encounter: Payer: Self-pay | Admitting: Family Medicine

## 2011-12-31 ENCOUNTER — Ambulatory Visit (INDEPENDENT_AMBULATORY_CARE_PROVIDER_SITE_OTHER): Payer: Medicare Other | Admitting: Family Medicine

## 2011-12-31 VITALS — BP 140/80 | HR 86 | Temp 96.5°F | Resp 16 | Ht 64.25 in | Wt 153.8 lb

## 2011-12-31 DIAGNOSIS — I10 Essential (primary) hypertension: Secondary | ICD-10-CM | POA: Insufficient documentation

## 2011-12-31 DIAGNOSIS — G894 Chronic pain syndrome: Secondary | ICD-10-CM | POA: Insufficient documentation

## 2011-12-31 DIAGNOSIS — H6692 Otitis media, unspecified, left ear: Secondary | ICD-10-CM

## 2011-12-31 DIAGNOSIS — E119 Type 2 diabetes mellitus without complications: Secondary | ICD-10-CM | POA: Insufficient documentation

## 2011-12-31 DIAGNOSIS — E7800 Pure hypercholesterolemia, unspecified: Secondary | ICD-10-CM | POA: Insufficient documentation

## 2011-12-31 DIAGNOSIS — F418 Other specified anxiety disorders: Secondary | ICD-10-CM | POA: Insufficient documentation

## 2011-12-31 DIAGNOSIS — H6590 Unspecified nonsuppurative otitis media, unspecified ear: Secondary | ICD-10-CM

## 2011-12-31 NOTE — Patient Instructions (Signed)
Otitis Media with Effusion  Otitis media with effusion is the presence of fluid in the middle ear. This is a common problem that often follows ear infections. It may be present for weeks or longer after the infection. Unlike an acute ear infection, otits media with effusion refers only to fluid behind the ear drum and not infection. Children with repeated ear and sinus infections and allergy problems are the most likely to get otitis media with effusion.  CAUSES   The most frequent cause of the fluid buildup is dysfunction of the eustacian tubes. These are the tubes that drain fluid in the ears to the throat.  SYMPTOMS    The main symptom of this condition is hearing loss. As a result, you or your child may:   Listen to the TV at a loud volume.   Not respond to questions.   Ask "what" often when spoken to.   There may be a sensation of fullness or pressure but usually not pain.  DIAGNOSIS    Your caregiver will diagnose this condition by examining you or your child's ears.   Your caregiver may test the pressure in you or your child's ear with a tympanometer.   A hearing test may be conducted if the problem persists.   A caregiver will want to re-evaluate the condition periodically to see if it improves.  TREATMENT    Treatment depends on the duration and the effects of the effusion.   Antibiotics, decongestants, nose drops, and cortisone-type drugs may not be helpful.   Children with persistent ear effusions may have delayed language. Children at risk for developmental delays in hearing, learning, and speech may require referral to a specialist earlier than children not at risk.   You or your child's caregiver may suggest a referral to an Ear, Nose, and Throat (ENT) surgeon for treatment. The following may help restore normal hearing:   Drainage of fluid.   Placement of ear tubes (tympanostomy tubes).   Removal of adenoids (adenoidectomy).  HOME CARE INSTRUCTIONS    Avoid second hand  smoke.   Infants who are breast fed are less likely to have this condition.   Avoid feeding infants while laying flat.   Avoid known environmental allergens.   Be sure to see a caregiver or an ENT specialist for follow up.   Avoid people who are sick.  SEEK MEDICAL CARE IF:    Hearing is not better in 3 months.   Hearing is worse.   Ear pain.   Drainage from the ear.   Dizziness.  Document Released: 11/12/2004 Document Revised: 09/24/2011 Document Reviewed: 02/25/2010  Exit[employer] Patient Information 2012 [employer].

## 2011-12-31 NOTE — Progress Notes (Signed)
  Subjective:    Patient ID: [name], female    DOB: 1941/10/02, 71 y.o.   MRN: [id number]  HPI  Thsi 71 y.o. Cauc female is here with her husband for re-check ear infection. The pt was referred  to Dr. Emeline Darling (ENT) last week and was started on Doxycycline and a rapid Prednisone taper. The pt  did not start the steroid because of concern re: blood sugar elevations. She started the antibiotic  on 12/26/11. She has hyperacusis and slight pain around the left ear. She denies fever, ear drainage  or sore throat. She denies hx of Allergic Rhinitis.    Review of Systems As per HPI    Objective:   Physical Exam  Vitals reviewed. Constitutional: She is oriented to person, place, and time. She appears well-developed and well-nourished. No distress.  HENT:  Head: Normocephalic and atraumatic.  Right Ear: External ear normal.  Left Ear: External ear normal.  Nose: Nose normal.  Mouth/Throat: Oropharynx is clear and moist. No oropharyngeal exudate.       Left ear: TM has evidence of effusion; posterior pharynx is a little erythematous  Eyes: EOM are normal. No scleral icterus.  Neck: Normal range of motion. Neck supple. No thyromegaly present.  Cardiovascular: Normal rate.   Pulmonary/Chest: Effort normal. No respiratory distress.  Lymphadenopathy:    She has no cervical adenopathy.  Neurological: She is alert and oriented to person, place, and time. No cranial nerve deficit.  Skin: Skin is warm and dry.  Psychiatric: She has a normal mood and affect. Her behavior is normal. Thought content normal.          Assessment & Plan:   1. Otitis media of left ear     Finish all meds prescribed by ENT; reassurance re: Prednisone. RTC 1 week.

## 2012-01-07 ENCOUNTER — Ambulatory Visit: Payer: Medicare Other | Admitting: Family Medicine

## 2012-01-08 ENCOUNTER — Other Ambulatory Visit: Payer: Self-pay | Admitting: Physical Medicine & Rehabilitation

## 2012-01-12 ENCOUNTER — Ambulatory Visit: Payer: Medicare Other | Admitting: Family Medicine

## 2012-01-19 ENCOUNTER — Encounter: Payer: Self-pay | Admitting: Cardiovascular Disease

## 2012-01-19 ENCOUNTER — Ambulatory Visit: Payer: Medicare Other | Admitting: Cardiovascular Disease

## 2012-01-19 ENCOUNTER — Ambulatory Visit (INDEPENDENT_AMBULATORY_CARE_PROVIDER_SITE_OTHER): Payer: Medicare Other | Admitting: Cardiovascular Disease

## 2012-01-19 VITALS — BP 150/85 | HR 72 | Ht 64.0 in | Wt 150.1 lb

## 2012-01-19 DIAGNOSIS — E119 Type 2 diabetes mellitus without complications: Secondary | ICD-10-CM

## 2012-01-19 DIAGNOSIS — E785 Hyperlipidemia, unspecified: Secondary | ICD-10-CM

## 2012-01-19 DIAGNOSIS — I10 Essential (primary) hypertension: Secondary | ICD-10-CM

## 2012-01-19 MED ORDER — NEBIVOLOL HCL 10 MG PO TABS: 10.0000 mg | ORAL_TABLET | Freq: Every day | ORAL | Status: DC

## 2012-01-19 NOTE — Assessment & Plan Note (Signed)
Exam blood pressure is mildly elevated. We will start her on BuSpar 10 mg a day which should help with her blood pressure. She is very careful about eating any exercise. We have told her to check her blood pressure on a daily basis. She will keep a blood pressure log and will bring that to her next office visit.  I will see her again in 3 months for an office visit.

## 2012-01-19 NOTE — Patient Instructions (Signed)
Your physician recommends that you schedule a follow-up appointment in: 3 MONTHS  Your physician has recommended you make the following change in your medication:   START BYSTOLIC 10 MG DAILY, GET BLOOD PRESSURE AT LEAST WEEKLY, PREFER DAILY AT FIRST WITH NEW MED START.

## 2012-01-19 NOTE — Progress Notes (Signed)
[name] Date of Birth  June 10, 1941 [facility]     [place] [facility]  [address]    [address]   [address] [place], [place]  [address]    [place], [place]  [address] [phone]  Fax  [phone]  [phone]  Fax [phone]   Problems: 1. Mild to moderate coronary artery disease 2. Hypertension 3. Diabetes mellitus 4. History of GI bleed   History of Present Illness:  Pt is doing well.  No episodes of chest pain.  She had some left leg and left face numbness.  She's not had any cardiac complaints.  Current Outpatient Prescriptions on File Prior to Visit  Medication Sig Dispense Refill  . ALPRAZolam (XANAX) 0.5 MG tablet Take 0.5 mg by mouth daily as needed.      Marland Kitchen CALCIUM PO Take by mouth.      . cholecalciferol (VITAMIN D) 1000 UNITS tablet Take by mouth daily.      Marland Kitchen gabapentin (NEURONTIN) 300 MG capsule Take 300 mg by mouth 3 (three) times daily.      Marland Kitchen HYDROcodone-acetaminophen (NORCO) 5-325 MG per tablet TAKE 1 TABLET BY MOUTH TWICE DAILY  60 tablet  0  . lisinopril (PRINIVIL,ZESTRIL) 10 MG tablet Take 10 mg by mouth daily.      . metFORMIN (GLUCOPHAGE) 500 MG tablet Take 500 mg by mouth 2 (two) times daily.      Marland Kitchen PARoxetine (PAXIL) 20 MG tablet Take 20 mg by mouth daily.      . promethazine (PHENERGAN) 25 MG tablet Take 25 mg by mouth every 6 (six) hours as needed.      . simvastatin (ZOCOR) 80 MG tablet Take 40 mg by mouth daily.       Marland Kitchen venlafaxine (EFFEXOR-XR) 75 MG 24 hr capsule Take 37.5 mg by mouth 2 (two) times daily.         Allergies  Allergen Reactions  . Gentamicin   . Polymyxin B   . Mupirocin Rash  . Pneumococcal Vaccines Rash    Past Medical History  Diagnosis Date  . Postlaminectomy syndrome, lumbar region   . Lumbosacral spondylosis without myelopathy   . Disturbance of skin sensation   . Disorders of sacrum     No past surgical history on file.  History  Smoking status  . Former Smoker -- 2.0 packs/day  for 30 years  . Types: Cigarettes  . Quit date: 10/19/1990  Smokeless tobacco  . Not on file    History  Alcohol Use: Not on file    Family History  Problem Relation Age of Onset  . Asthma Sister   . Allergies Sister   . Diabetes Brother   . Hypertension Brother     Reviw of Systems:  Reviewed in the HPI.  All other systems are negative.  Physical Exam: Blood pressure 150/85, pulse 72, height 5\' 4"  (1.626 m), weight 150 lb 1.9 oz (68.094 kg). General: Well developed, well nourished, in no acute distress.  Head: Normocephalic, atraumatic, sclera non-icteric, mucus membranes are moist,   Neck: Supple. Carotids are 2 + without bruits. No JVD  Lungs: Clear bilaterally to auscultation.  Heart: regular rate.  normal  S1 S2. No murmurs, gallops or rubs.  Abdomen: Soft, non-tender, non-distended with normal bowel sounds. No hepatomegaly. No rebound/guarding. No masses.  Msk:  Strength and tone are normal  Extremities: No clubbing or cyanosis. No edema.  Distal pedal pulses are 2+ and equal bilaterally.  Neuro:  Alert and oriented X 3. Moves all extremities spontaneously.  Psych:  Responds to questions appropriately with a normal affect.  ECG: 01/19/2012-normal sinus rhythm at 70 beats per minute. Normal EKG.  Assessment / Plan:

## 2012-01-19 NOTE — Progress Notes (Signed)
Patient ID: [name], female   DOB: 07/07/1941, 71 y.o.   MRN: [id number]

## 2012-02-16 ENCOUNTER — Ambulatory Visit (INDEPENDENT_AMBULATORY_CARE_PROVIDER_SITE_OTHER): Payer: Medicare Other | Admitting: Family Medicine

## 2012-02-16 ENCOUNTER — Encounter: Payer: Self-pay | Admitting: Family Medicine

## 2012-02-16 VITALS — BP 139/73 | HR 58 | Temp 98.1°F | Resp 16 | Ht 63.5 in | Wt 157.0 lb

## 2012-02-16 DIAGNOSIS — R5381 Other malaise: Secondary | ICD-10-CM

## 2012-02-16 DIAGNOSIS — D649 Anemia, unspecified: Secondary | ICD-10-CM

## 2012-02-16 DIAGNOSIS — R5383 Other fatigue: Secondary | ICD-10-CM

## 2012-02-16 DIAGNOSIS — M898X9 Other specified disorders of bone, unspecified site: Secondary | ICD-10-CM

## 2012-02-16 DIAGNOSIS — M899 Disorder of bone, unspecified: Secondary | ICD-10-CM

## 2012-02-16 DIAGNOSIS — E119 Type 2 diabetes mellitus without complications: Secondary | ICD-10-CM

## 2012-02-16 DIAGNOSIS — E871 Hypo-osmolality and hyponatremia: Secondary | ICD-10-CM

## 2012-02-16 LAB — CBC WITH DIFFERENTIAL/PLATELET
Hemoglobin: 10.7 g/dL — ABNORMAL LOW (ref 12.0–15.0)
Lymphocytes Relative: 21 % (ref 12–46)
Lymphs Abs: 2 10*3/uL (ref 0.7–4.0)
Monocytes Relative: 13 % — ABNORMAL HIGH (ref 3–12)
Neutrophils Relative %: 62 % (ref 43–77)
Platelets: 221 10*3/uL (ref 150–400)
RBC: 3.24 MIL/uL — ABNORMAL LOW (ref 3.87–5.11)
WBC: 9.2 10*3/uL (ref 4.0–10.5)

## 2012-02-16 LAB — POCT GLYCOSYLATED HEMOGLOBIN (HGB A1C): Hemoglobin A1C: 5.1

## 2012-02-16 NOTE — Patient Instructions (Addendum)
Hypoglycemia (Low Blood Sugar) Hypoglycemia is when the glucose (sugar) in your blood is too low. Hypoglycemia can happen for many reasons. It can happen to people with or without diabetes. Hypoglycemia can develop quickly and can be a medical emergency.  CAUSES  Having hypoglycemia does not mean that you will develop diabetes. Different causes include:  Missed or delayed meals or not enough carbohydrates eaten.   Medication overdose. This could be by accident or deliberate. If by accident, your medication may need to be adjusted or changed.   Exercise or increased activity without adjustments in carbohydrates or medications.   A nerve disorder that affects body functions like your heart rate, blood pressure and digestion (autonomic neuropathy).   A condition where the stomach muscles do not function properly (gastroparesis). Therefore, medications may not absorb properly.   The inability to recognize the signs of hypoglycemia (hypoglycemic unawareness).   Absorption of insulin - may be altered.   Alcohol consumption.   Pregnancy/menstrual cycles/postpartum. This may be due to hormones.   Certain kinds of tumors. This is very rare.  SYMPTOMS   Sweating.   Hunger.     Follow your meal plan. Do not skip meals. Eat on time.   If you are going to drink alcohol, drink it only with meals.   Check your blood glucose before driving.   Check your blood glucose before and after exercise. If you exercise longer or different than usual, be sure to check blood glucose more frequently.   Always carry treatment with you. Glucose tablets are the easiest to carry.   Always wear medical alert jewelry or carry some form of identification that states that you have diabetes. This will alert people that you have diabetes. If you have hypoglycemia, they will have a better idea on what to do.  SEEK MEDICAL CARE IF:   You are having problems keeping your blood sugar at target range.   You are  having frequent episodes of hypoglycemia.   You feel you might be having side effects from your medicines.   You have symptoms of an illness that is not improving after 3-4 days.   You notice a change in vision or a new problem with your vision.  SEEK IMMEDIATE MEDICAL CARE IF:   You are a family member or friend of a person whose blood glucose goes below 70 mg/dl and is accompanied by:   Confusion.  A change in mental status.       Diabetes Meal Planning Guide The diabetes meal planning guide is a tool to help you plan your meals and snacks. It is important for people with diabetes to manage their blood glucose (sugar) levels. Choosing the right foods and the right amounts throughout your day will help control your blood glucose. Eating right can even help you improve your blood pressure and reach or maintain a healthy weight. CARBOHYDRATE COUNTING MADE EASY When you eat carbohydrates, they turn to sugar. This raises your blood glucose level. Counting carbohydrates can help you control this level so you feel better. When you plan your meals by counting carbohydrates, you can have more flexibility in what you eat and balance your medicine with your food intake. Carbohydrate counting simply means adding up the total amount of carbohydrate grams in your meals and snacks. Try to eat about the same amount at each meal. Foods with carbohydrates are listed below. Each portion below is 1 carbohydrate serving or 15 grams of carbohydrates. Ask your dietician how many grams  of carbohydrates you should eat at each meal or snack. Grains and Starches 1 slice bread.   English muffin or hotdog/hamburger bun.   cup cold cereal (unsweetened).  ? cup cooked pasta or rice.   cup starchy vegetables (corn, potatoes, peas, beans, winter squash).  1 tortilla (6 inches).   bagel.  1 waffle or pancake (size of a CD).   cup cooked cereal.  4 to 6 small crackers.  *Whole grain is recommended. Fruit 1  cup fresh unsweetened berries, melon, papaya, pineapple.  1 small fresh fruit.   banana or mango.   cup fruit juice (4 oz unsweetened).   cup canned fruit in natural juice or water.  2 tbs dried fruit.  12 to 15 grapes or cherries.  Milk and Yogurt 1 cup fat-free or 1% milk.  1 cup soy milk.  6 oz light yogurt with sugar-free sweetener.  6 oz low-fat soy yogurt.  6 oz plain yogurt.  Vegetables 1 cup raw or  cup cooked is counted as 0 carbohydrates or a "free" food.  If you eat 3 or more servings at 1 meal, count them as 1 carbohydrate serving.  Other Carbohydrates  oz chips or pretzels.   cup ice cream or frozen yogurt.   cup sherbet or sorbet.  2 inch square cake, no frosting.  1 tbs honey, sugar, jam, jelly, or syrup.  2 small cookies.  3 squares of graham crackers.  3 cups popcorn.  6 crackers.  1 cup broth-based soup.  Count 1 cup casserole or other mixed foods as 2 carbohydrate servings.  Foods with less than 20 calories in a serving may be counted as 0 carbohydrates or a "free" food.  You may want to purchase a book or computer software that lists the carbohydrate gram counts of different foods. In addition, the nutrition facts panel on the labels of the foods you eat are a good source of this information. The label will tell you how big the serving size is and the total number of carbohydrate grams you will be eating per serving. Divide this number by 15 to obtain the number of carbohydrate servings in a portion. Remember, 1 carbohydrate serving equals 15 grams of carbohydrate. SERVING SIZES Measuring foods and serving sizes helps you make sure you are getting the right amount of food. The list below tells how big or small some common serving sizes are. 1 oz.........4 stacked dice.  3 oz........Marland KitchenDeck of cards.  1 tsp.......Marland KitchenTip of little finger.  1 tbs......Marland KitchenMarland KitchenThumb.  2 tbs.......Marland KitchenGolf ball.   cup......Marland KitchenHalf of a fist.  1 cup.......Marland KitchenA fist.  SAMPLE DIABETES MEAL  PLAN Below is a sample meal plan that includes foods from the grain and starches, dairy, vegetable, fruit, and meat groups. A dietician can individualize a meal plan to fit your calorie needs and tell you the number of servings needed from each food group. However, controlling the total amount of carbohydrates in your meal or snack is more important than making sure you include all of the food groups at every meal. You may interchange carbohydrate containing foods (dairy, starches, and fruits). The meal plan below is an example of a 2000 calorie diet using carbohydrate counting. This meal plan has 17 carbohydrate servings. Breakfast 1 cup oatmeal (2 carb servings).   cup light yogurt (1 carb serving).  1 cup blueberries (1 carb serving).   cup almonds.  Snack 1 large apple (2 carb servings).  1 low-fat string cheese stick.  Lunch Chicken breast salad.  1 cup spinach.   cup chopped tomatoes.  2 oz chicken breast, sliced.  2 tbs low-fat [place] dressing.  12 whole-wheat crackers (2 carb servings).  12 to 15 grapes (1 carb serving).  1 cup low-fat milk (1 carb serving).  Snack 1 cup carrots.   cup hummus (1 carb serving).  Dinner 3 oz broiled salmon.  1 cup brown rice (3 carb servings).  Snack 1  cups steamed broccoli (1 carb serving) drizzled with 1 tsp olive oil and lemon juice.  1 cup light pudding (2 carb servings).  DIABETES MEAL PLANNING WORKSHEET Your dietician can use this worksheet to help you decide how many servings of foods and what types of foods are right for you.  BREAKFAST Food Group and Servings / Carb Servings Grain/Starches __________________________________ Dairy __________________________________________ Vegetable ______________________________________ Fruit ___________________________________________ Meat __________________________________________ Fat ____________________________________________ LUNCH Food Group and Servings / Carb  Servings Grain/Starches ___________________________________ Dairy ___________________________________________ Fruit ____________________________________________ Meat ___________________________________________ Fat _____________________________________________ Laural Golden Food Group and Servings / Carb Servings Grain/Starches ___________________________________ Dairy ___________________________________________ Fruit ____________________________________________ Meat ___________________________________________ Fat _____________________________________________ SNACKS Food Group and Servings / Carb Servings Grain/Starches ___________________________________ Dairy ___________________________________________ Vegetable _______________________________________ Fruit ____________________________________________ Meat ___________________________________________ Fat _____________________________________________ DAILY TOTALS Starches _________________________ Vegetable ________________________ Fruit ____________________________ Dairy ____________________________ Meat ____________________________ Fat ______________________________ Document Released: 07/02/2005 Document Revised: 09/24/2011 Document Reviewed: 05/13/2009  [employer] Patient Information 2012 [place], [place].  The inability to swallow.   Passing out.  Document Released: 10/05/2005 Document Revised: 09/24/2011 Document Reviewed: 05/30/2009 [facility] Patient Information 2012 [place], [place].     Medication change:  Take 1/2 Metformin with your AM meal and take 1 whole Metformin with your evening meal.

## 2012-02-17 ENCOUNTER — Encounter: Payer: Self-pay | Admitting: Family Medicine

## 2012-02-17 LAB — LIPID PANEL
Cholesterol: 173 mg/dL (ref 0–200)
HDL: 60 mg/dL (ref >39)
Total CHOL/HDL Ratio: 2.9 Ratio
Triglycerides: 196 mg/dL — ABNORMAL HIGH (ref <150)

## 2012-02-17 LAB — COMPREHENSIVE METABOLIC PANEL
Albumin: 4.5 g/dL (ref 3.5–5.2)
BUN: 14 mg/dL (ref 6–23)
CO2: 27 mEq/L (ref 19–32)
Glucose, Bld: 84 mg/dL (ref 70–99)
Potassium: 5.2 mEq/L (ref 3.5–5.3)
Sodium: 116 mEq/L — ABNORMAL LOW (ref 135–145)
Total Bilirubin: 0.8 mg/dL (ref 0.3–1.2)
Total Protein: 6.7 g/dL (ref 6.0–8.3)

## 2012-02-17 NOTE — Progress Notes (Signed)
Subjective:    Patient ID: [name], female    DOB: 05-26-1941, 71 y.o.   MRN: [id number]  HPI  This 71 y.o. Cauc female is here with her husband for Medicare  Wellness (AWV-S) exam;  however, she has several chronic medical issues that she and the husband want to address.  They have a print-out  from Dr. Lorenz Coaster, their previous PCP, which reviews all chronic medical diagnoses. She has Diabetes and checks FSBS 2-3x per week; low=70 (mildly symptomatic). Nutrition- pt skips  breakfast, not really following a meal plan but feels that she eats healthy. Requests medication refills.  Pt and husband are preparing to sell home and move to [place] (not sure when).   She had PAP last  March 2012 and is declining that today. She saw Dr. Melburn Popper ~ 1 month ago; had  ECG at that time.Last Colonoscopy was ~ 5 years ago (normal).  MMG: 12/17/10  Eye exam: Nov 2012  Dental exam: Jan 2013  DEXA: 10/09/11- Osteopenia  Review of Systems  Constitutional: Positive for appetite change, fatigue and unexpected weight change.  HENT: Negative for hearing loss, congestion, rhinorrhea, sneezing, mouth sores, trouble swallowing, dental problem, sinus pressure and tinnitus.   Eyes: Negative for pain, redness and visual disturbance.       Dry eyes  Respiratory: Negative for cough, chest tightness and shortness of breath.   Cardiovascular: Negative for chest pain and palpitations.  Gastrointestinal: Negative for nausea, vomiting, abdominal pain, constipation and blood in stool.  Genitourinary: Negative.   Musculoskeletal: Positive for arthralgias.  Neurological: Positive for weakness.  Psychiatric/Behavioral: Positive for dysphoric mood. Negative for confusion, sleep disturbance and decreased concentration. The patient is nervous/anxious.        Objective:   Physical Exam  Nursing note and vitals reviewed. Constitutional: She is oriented to person, place, and time. She appears well-developed and  well-nourished. No distress.  HENT:  Head: Normocephalic and atraumatic.  Right Ear: External ear normal.  Left Ear: External ear normal.  Nose: Nose normal.  Mouth/Throat: Oropharynx is clear and moist.  Eyes: Conjunctivae and EOM are normal. No scleral icterus.       S/P Cataract surgery with lens implants  Neck: Normal range of motion. Neck supple. No JVD present.  Cardiovascular: Normal rate, regular rhythm and normal heart sounds.  Exam reveals no gallop and no friction rub.   No murmur heard. Pulmonary/Chest: Effort normal and breath sounds normal. No respiratory distress. She has no wheezes. She has no rales.  Abdominal: Soft. Bowel sounds are normal. She exhibits distension. She exhibits no mass. There is no tenderness. There is no guarding.  Genitourinary:       Deferred  Musculoskeletal: She exhibits tenderness. She exhibits no edema.       Decreased ROM and stiffness noted in major joints, wrists and hands  Lymphadenopathy:    She has no cervical adenopathy.  Neurological: She is alert and oriented to person, place, and time. No cranial nerve deficit. She exhibits normal muscle tone. Coordination normal.       DTRs: 1+; difficult to assess Gait seems to stiff (known to have lumbar disc disease and is S/P 2 surgeries)and mildly ataxic.  Skin: Skin is warm and dry. No rash noted. There is pallor.  Psychiatric: She has a normal mood and affect. Her behavior is normal. Judgment and thought content normal.       Pt's husband remarks that she has better memory for distant past and his memory  is better for recent events    HbA1C= 5.1%      Assessment & Plan:   1. Type II or unspecified type diabetes mellitus without mention of complication, not stated as uncontrolled  Comprehensive metabolic panel, Lipid panel Reduce Metformin 500 mg 1/2 tab with breakfast and  continue Metformin 500 mg 1 tablet with PM meal   2. Screening for deficiency anemia    3. Fatigue  CBC with  Differential, TSH  4. Bone pain  CBC with Differential, Vitamin D, 25-hydroxy  5. Anemia  Iron and TIBC, Ferritin, Reticulocytes       Pt is advised to space out medications instead of taking a handful of pills on an empty stomach. Always take Metformin with meals Try to follow a meal plan (Diabetic meal planning info given to pt). Test FSBS 2x daily at different times to assess blood sugar response to meals and pre-meal blood sugar. Check FSBS before bedtime to assess for hypoglycemia; take snack if FSBS at or < 70.   (Visit Diagnosis- Hyponatremia is associated with future lab order for BMET; labs drawn at day of visit revealed Sodium=116.)

## 2012-02-17 NOTE — Progress Notes (Signed)
Quick Note:  Please call pt and advise that the following labs are abnormal...   Sodium level is low; pt needs to add a little salt to foods. Also, this may be related to some of her medications (like Metformin and blood pressure medications). This lab needs to be rechecked in 2-3 weeks (I have placed a future order). Also, CBC shows Anemia; other tests are pending to determine if this is an Iron-deficiency anemia. Try to improve your diet as we discussed and increase iron-rich foods.  Vitamin D is a little low; we talked about this at your visit.   Copy to pt. ______

## 2012-02-18 LAB — IRON AND TIBC
%SAT: 42 % (ref 20–55)
TIBC: 344 ug/dL (ref 250–470)
UIBC: 199 ug/dL (ref 125–400)

## 2012-02-18 LAB — RETICULOCYTES: Retic Ct Pct: 2.1 % (ref 0.4–2.3)

## 2012-02-18 LAB — FERRITIN: Ferritin: 57 ng/mL (ref 10–291)

## 2012-02-19 ENCOUNTER — Encounter: Payer: Self-pay | Admitting: Physical Medicine & Rehabilitation

## 2012-02-19 ENCOUNTER — Ambulatory Visit (HOSPITAL_BASED_OUTPATIENT_CLINIC_OR_DEPARTMENT_OTHER): Payer: Medicare Other | Admitting: Physical Medicine & Rehabilitation

## 2012-02-19 ENCOUNTER — Encounter: Payer: Medicare Other | Attending: Physical Medicine & Rehabilitation

## 2012-02-19 VITALS — BP 162/90 | HR 60 | Ht 64.0 in | Wt 156.0 lb

## 2012-02-19 DIAGNOSIS — M961 Postlaminectomy syndrome, not elsewhere classified: Secondary | ICD-10-CM | POA: Insufficient documentation

## 2012-02-19 DIAGNOSIS — M47817 Spondylosis without myelopathy or radiculopathy, lumbosacral region: Secondary | ICD-10-CM | POA: Insufficient documentation

## 2012-02-19 DIAGNOSIS — M47816 Spondylosis without myelopathy or radiculopathy, lumbar region: Secondary | ICD-10-CM

## 2012-02-19 DIAGNOSIS — M545 Low back pain, unspecified: Secondary | ICD-10-CM | POA: Insufficient documentation

## 2012-02-19 DIAGNOSIS — E1142 Type 2 diabetes mellitus with diabetic polyneuropathy: Secondary | ICD-10-CM

## 2012-02-19 DIAGNOSIS — E1149 Type 2 diabetes mellitus with other diabetic neurological complication: Secondary | ICD-10-CM

## 2012-02-19 DIAGNOSIS — E1140 Type 2 diabetes mellitus with diabetic neuropathy, unspecified: Secondary | ICD-10-CM

## 2012-02-19 MED ORDER — GABAPENTIN 300 MG PO CAPS: 300.0000 mg | ORAL_CAPSULE | Freq: Four times a day (QID) | ORAL | Status: DC

## 2012-02-19 MED ORDER — HYDROCODONE-ACETAMINOPHEN 5-325 MG PO TABS: 1.0000 | ORAL_TABLET | Freq: Three times a day (TID) | ORAL | Status: DC | PRN

## 2012-02-19 NOTE — Progress Notes (Signed)
  Subjective:    Patient ID: [name], female    DOB: 1941/07/12, 71 y.o.   MRN: [id number]  HPI She underwent right L2-3-4 radiofrequency September 24, 2011. Results  have been okay, but not as good as before. Her previous RF was  performed January 29, 2011, on the right side. She had an episode where  she had some numbness in the left arm and left leg when she was standing  at the sink lasted no more than 4 minutes. She had no loss of function  in her upper extremity. She has had no prior episodes that are similar.  She has been told by her cardiologist Dr. Elease Hashimoto that she should be  taking a baby aspirin a day, but has not been doing so. This has  resolved. No recurrence. No gait difficulty.  Pain Inventory Average Pain 5 Pain Right Now 4 My pain is intermittent  In the last 24 hours, has pain interfered with the following? General activity 7 Relation with others 5 Enjoyment of life 5 What TIME of day is your pain at its worst? evening Sleep (in general) Good  Pain is worse with: walking, bending, inactivity and standing Pain improves with: rest and medication Relief from Meds: 5  Mobility walk without assistance how many minutes can you walk? 15 min ability to climb steps?  yes do you drive?  no needs help with transfers Do you have any goals in this area?  yes  Function retired  Neuro/Psych weakness numbness depression anxiety  Prior Studies Any changes since last visit?  no  Physicians involved in your care Any changes since last visit?  no        Review of Systems  All other systems reviewed and are negative.       Objective:   Physical Exam  Constitutional: She is oriented to person, place, and time. She appears well-developed.  Musculoskeletal:       Lumbar back: She exhibits decreased range of motion.       Decreased lumbar extension normal lumbar flexion Straight leg raising test is negative  Neurological: She is alert and oriented  to person, place, and time. She has normal strength. A sensory deficit is present.       Numbness in both feet  Psychiatric: Thought content normal. Her affect is blunt. Her speech is delayed. She is slowed.          Assessment & Plan:  1. Lumbar postlaminectomy syndrome we'll continue her hydrocodone and increased from 2 tablets per day to 3 tablets per day of the 5 mg dose 2. Diabetic neuropathy Will increase her gabapentin to 300 mg 3 times a day to 4 times a day cautioned on potential drowsiness.  Discussed with patient and husband that acupuncture may be helpful for her. Would recommend a trial of once a week for 4 weeks to see if this would be helpful for her. Return in 3 months with PA visit can review exercises for her lumbar pain.

## 2012-02-19 NOTE — Patient Instructions (Addendum)
We will increase the gabapentin to 4 times a day this may cause drowsiness. Call us if you feel more drowsy We will increase the hydrocodone to 90 tablets per month at the 5 mg dose. You can take 2 a day on good days and 3 day on days when you're experiencing more pain Trial of acupuncture once a week for 4 weeks may be helpful.

## 2012-02-21 ENCOUNTER — Other Ambulatory Visit: Payer: Self-pay | Admitting: Family Medicine

## 2012-02-21 DIAGNOSIS — D649 Anemia, unspecified: Secondary | ICD-10-CM

## 2012-02-21 DIAGNOSIS — Z1211 Encounter for screening for malignant neoplasm of colon: Secondary | ICD-10-CM

## 2012-02-21 DIAGNOSIS — Z1212 Encounter for screening for malignant neoplasm of rectum: Secondary | ICD-10-CM

## 2012-02-21 NOTE — Progress Notes (Signed)
Quick Note:  Please call pt and advise that the following labs are abnormal...  The labs that were done to determine if you have an iron-deficiency anemia were all normal except for one test that suggest some blood loss. Because your last Colonoscopy was 5 years ago, I am referring you for Colonoscopy to  check for hidden bleeding in your digestive tract. You will be notified about that referral once the appointment has been made.  Copy to pt.  ______

## 2012-02-22 ENCOUNTER — Encounter: Payer: Self-pay | Admitting: Gastroenterology

## 2012-02-23 NOTE — Progress Notes (Signed)
Lab gave pt results and mailed copy to pt on 02/23/12

## 2012-02-26 ENCOUNTER — Telehealth: Payer: Self-pay

## 2012-02-26 NOTE — Telephone Encounter (Signed)
Spoke with patient and they understand about the gi referral.  They would like to be referred to Dr. Loreta Ave.

## 2012-02-26 NOTE — Telephone Encounter (Signed)
i dont see a referral for a cardiologist, can you please advise on this?

## 2012-02-26 NOTE — Telephone Encounter (Signed)
Patient was referred to GI not cardiology, she already has a cardiologist. This was for anemia to look for hidden bleeding

## 2012-02-26 NOTE — Telephone Encounter (Signed)
Pt has questions about why she is being sent to a cardiologist  Please call either alan or Aino about this   [phone]

## 2012-03-10 ENCOUNTER — Ambulatory Visit: Payer: Medicare Other | Admitting: Gastroenterology

## 2012-04-15 ENCOUNTER — Ambulatory Visit: Payer: Medicare Other | Admitting: Cardiovascular Disease

## 2012-04-25 ENCOUNTER — Other Ambulatory Visit: Payer: Self-pay

## 2012-04-25 MED ORDER — HYDROCODONE-ACETAMINOPHEN 5-325 MG PO TABS: 1.0000 | ORAL_TABLET | Freq: Three times a day (TID) | ORAL | Status: DC | PRN

## 2012-05-19 ENCOUNTER — Ambulatory Visit: Payer: Medicare Other | Admitting: Cardiovascular Disease

## 2012-05-23 ENCOUNTER — Encounter: Payer: Medicare Other | Admitting: Physical Medicine and Rehabilitation

## 2012-05-31 ENCOUNTER — Ambulatory Visit: Payer: Medicare Other | Admitting: Cardiovascular Disease

## 2012-06-17 ENCOUNTER — Encounter: Payer: Self-pay | Admitting: Family Medicine

## 2012-06-17 ENCOUNTER — Ambulatory Visit (INDEPENDENT_AMBULATORY_CARE_PROVIDER_SITE_OTHER): Payer: Medicare Other | Admitting: Family Medicine

## 2012-06-17 VITALS — BP 180/90 | HR 62 | Temp 98.3°F | Resp 16 | Ht 63.5 in | Wt 154.9 lb

## 2012-06-17 DIAGNOSIS — E119 Type 2 diabetes mellitus without complications: Secondary | ICD-10-CM

## 2012-06-17 DIAGNOSIS — S0180XA Unspecified open wound of other part of head, initial encounter: Secondary | ICD-10-CM

## 2012-06-17 DIAGNOSIS — E871 Hypo-osmolality and hyponatremia: Secondary | ICD-10-CM

## 2012-06-17 DIAGNOSIS — S0181XA Laceration without foreign body of other part of head, initial encounter: Secondary | ICD-10-CM

## 2012-06-17 DIAGNOSIS — D649 Anemia, unspecified: Secondary | ICD-10-CM

## 2012-06-17 DIAGNOSIS — I10 Essential (primary) hypertension: Secondary | ICD-10-CM

## 2012-06-17 DIAGNOSIS — Z23 Encounter for immunization: Secondary | ICD-10-CM

## 2012-06-17 LAB — BASIC METABOLIC PANEL
CO2: 26 mEq/L (ref 19–32)
Calcium: 9.7 mg/dL (ref 8.4–10.5)
Creat: 0.87 mg/dL (ref 0.50–1.10)
Sodium: 125 mEq/L — ABNORMAL LOW (ref 135–145)

## 2012-06-17 LAB — CBC
HCT: 34.1 % — ABNORMAL LOW (ref 36.0–46.0)
Hemoglobin: 11.6 g/dL — ABNORMAL LOW (ref 12.0–15.0)
RBC: 3.53 MIL/uL — ABNORMAL LOW (ref 3.87–5.11)
WBC: 9.3 10*3/uL (ref 4.0–10.5)

## 2012-06-20 NOTE — Progress Notes (Signed)
S: Pt returns today for Type II DM follow-up and recheck of BMET (she did not have sodium rechecked in May after low value= 116 was detected in April). She feels better and has been taking medications as prescribed (Metformin was reduced to   improvement glycemic control, reduce low BS episodes and increase sodium level) Pt did have a mishap at home where she tripped on a cord and struck her chin, resulting in a laceration. She was able to control the bleeding with pressure and  today, the laceration has a scab. Pt has minimal pain and denies LOC with accident. She denies HA, dizziness or weakness today.  ROS: As per HPI.  O:  Filed Vitals:   06/17/12 1513  BP: 180/90  Pulse: 62  Temp:   Resp:   GEN: In NAD; WN,WD. HENT: [place]/AT except submandibular area of chin has 1 cm laceration with mature scab and minimal swelling; slightly tender to touch. COR: RRR LUNGS: Normal resp rate and effort. NEURO: A&O x 3; CNs intact; pt attentive and appropriate in conversation. Moves all ext and noo abnormal movement noted.    A/P: 1. Hyponatremia  Basic metabolic panel  2. Type II or unspecified type diabetes mellitus without mention of complication, not stated as uncontrolled - well controlled as last A1C= 5.1% in April   3. HTN (hypertension) - pt has been at goal at past visits Continue current medications with goal BP=140-150/70-80.  4. Anemia  CBC Hgb has improved from 10.7 to 11.6  5. Laceration of chin  No treated needed; wound care discussed.  6. Need for prophylactic vaccination with combined diphtheria-tetanus-pertussis (DTP) vaccine  Tdap vaccine greater than or equal to 7yo IM

## 2012-06-20 NOTE — Progress Notes (Signed)
Quick Note:  Please call pt and advise that the following labs are abnormal... Sodium level has come up; continue current medications and diet. Anemia has improved (Hgb has come up a full point).  Remind pt to RTC for BP check only (with nurse) in 6-8 weeks.  Copy topt. ______

## 2012-07-06 ENCOUNTER — Ambulatory Visit (INDEPENDENT_AMBULATORY_CARE_PROVIDER_SITE_OTHER): Payer: Medicare Other | Admitting: Family Medicine

## 2012-07-06 ENCOUNTER — Ambulatory Visit: Payer: Medicare Other

## 2012-07-06 VITALS — BP 164/102 | HR 80 | Temp 99.6°F | Resp 16 | Ht 64.0 in | Wt 155.0 lb

## 2012-07-06 DIAGNOSIS — M25529 Pain in unspecified elbow: Secondary | ICD-10-CM

## 2012-07-06 DIAGNOSIS — M25551 Pain in right hip: Secondary | ICD-10-CM

## 2012-07-06 DIAGNOSIS — M25532 Pain in left wrist: Secondary | ICD-10-CM

## 2012-07-06 DIAGNOSIS — M25521 Pain in right elbow: Secondary | ICD-10-CM

## 2012-07-06 DIAGNOSIS — M25559 Pain in unspecified hip: Secondary | ICD-10-CM

## 2012-07-06 DIAGNOSIS — M25539 Pain in unspecified wrist: Secondary | ICD-10-CM

## 2012-07-06 DIAGNOSIS — S51009A Unspecified open wound of unspecified elbow, initial encounter: Secondary | ICD-10-CM

## 2012-07-06 MED ORDER — CEPHALEXIN 500 MG PO CAPS: 500.0000 mg | ORAL_CAPSULE | Freq: Two times a day (BID) | ORAL | Status: DC

## 2012-07-06 NOTE — Progress Notes (Signed)
   Patient ID: KYNZI LEVAY MRN: [id number], DOB: [birth year], 71 y.o. Date of Encounter: 07/06/2012, 3:58 PM   PROCEDURE NOTE: Verbal consent obtained. Sterile technique employed. Numbing: Anesthesia obtained with 1% lidocaine 3 cc for local anesthesia along left elbow.  Cleansed with soap and water. Irrigated. Betadine prep per usual protocol.  Wound explored, no deep structures involved, no foreign bodies.   Wound repaired with # 3 simple interrupted sutures of 4-0 Prolene sutures. Hemostasis obtained. Wound cleansed and dressed.  Wound care instructions including precautions covered with patient. Handout given.  Anticipate suture removal in 10-12 days.  SignedEula Listen, PA-C 07/06/2012 3:58 PM

## 2012-07-06 NOTE — Patient Instructions (Signed)
Ok to remove braces temporarily for gentle range of motion if tolerated.  Take antibiotic twice per day to lessen chance of infection in wound - but see wound care below. Recheck x ray of wrist in 10 days, return sooner if any worsening of pain - especially any new or worsening hip pain. WOUND CARE Please return in 10 days to have your stitches/staples removed or sooner if you have concerns. Marland Kitchen Keep area clean and dry for 24 hours. Do not remove bandage, if applied. . After 24 hours, remove bandage and wash wound gently with mild soap and warm water. Reapply a new bandage after cleaning wound, if directed. . Continue daily cleansing with soap and water until stitches/staples are removed. . Do not apply any ointments or creams to the wound while stitches/staples are in place, as this may cause delayed healing. . Notify the office if you experience any of the following signs of infection: Swelling, redness, pus drainage, streaking, fever >101.0 F . Notify the office if you experience excessive bleeding that does not stop after 15-20 minutes of constant, firm pressure.

## 2012-07-06 NOTE — Progress Notes (Signed)
  Subjective:    Patient ID: ONITA PFLUGER, female    DOB: 1941-04-25, 71 y.o.   MRN: [id number]  HPI TAMAR MIANO is a 71 y.o. female  Larey Seat last night about 10pm - walking - tripped, and fell onto L elbow.  Bleeding.  Cleaned this am with peroxide.  Bandaged.  r hand dominant. No prior L elbow injury. Last td few months ago. Noticed bruising in wrist today, but no pain in wrist. No Foosh - fell directly on elbow.    Hx of hip pain - few weeks.  Fell few weeks ago.  Tripped then. Able to weight bear, but persistent pain.   Has not had this evaluated.  No treatments except hydrocodone (hx of chronic back pain - taking hydrocodone once per day). No bruising.  Pain on outside of R hip.   Primary care - Audria Nine, has appt tomorrow.       Review of Systems     Objective:   Physical Exam  Constitutional: She appears well-developed and well-nourished.  Pulmonary/Chest: Effort normal.  Musculoskeletal:       Left elbow: She exhibits laceration. She exhibits normal range of motion.       Left wrist: She exhibits bony tenderness. She exhibits normal range of motion, no swelling and no effusion.       Arms:      Legs: Skin:       [facility] reading (PRIMARY) by  Dr. Neva Seat: L wrist - DJD at [facility], no apparent fx., L elbow - small ant fat pad, but no apparent fx., R hip - DJD , no apparent fx.      Assessment & Plan:  ILYANNA BAILLARGEON is a 71 y.o. female 1. Hip pain, right  DG Hip Complete Right  2. Wrist pain, left  DG Wrist Complete Left  3. Elbow pain, right  DG Elbow Complete Left    L elbow contusion - no apparent fx, but small ant fat pad.  Sling for comfort for 7-10 days, then recheck to determine if continued use needed or ROM.  Ok for gentle rom few times per day.  Wound repaired per procedure note. Keflex 500mg  BID x 7 days.  for coverage as prolonged time to care with wound.   R hip pain - post fall 2 weeks ago - posterior pain - possible oa flair.  No fx identified, but  discussed need for MRI if persistent or worsening pain - understanding expressed.   L wrist contusion - ecchymosis, no fx identified, but with ttp over scaphoid - will thumb spica brace x 10 days then recheck.    Has follow up in place tomorrow with primary provider- can wound check then.

## 2012-07-07 ENCOUNTER — Ambulatory Visit: Payer: Medicare Other

## 2012-07-07 ENCOUNTER — Ambulatory Visit (INDEPENDENT_AMBULATORY_CARE_PROVIDER_SITE_OTHER): Payer: Medicare Other | Admitting: Family Medicine

## 2012-07-07 ENCOUNTER — Encounter: Payer: Self-pay | Admitting: Family Medicine

## 2012-07-07 VITALS — BP 166/76 | HR 74 | Temp 98.0°F | Resp 18 | Ht 65.5 in | Wt 160.0 lb

## 2012-07-07 DIAGNOSIS — M51369 Other intervertebral disc degeneration, lumbar region without mention of lumbar back pain or lower extremity pain: Secondary | ICD-10-CM

## 2012-07-07 DIAGNOSIS — M5136 Other intervertebral disc degeneration, lumbar region: Secondary | ICD-10-CM

## 2012-07-07 DIAGNOSIS — R296 Repeated falls: Secondary | ICD-10-CM

## 2012-07-07 DIAGNOSIS — D649 Anemia, unspecified: Secondary | ICD-10-CM

## 2012-07-07 DIAGNOSIS — E871 Hypo-osmolality and hyponatremia: Secondary | ICD-10-CM

## 2012-07-07 DIAGNOSIS — M545 Low back pain: Secondary | ICD-10-CM

## 2012-07-07 DIAGNOSIS — R29898 Other symptoms and signs involving the musculoskeletal system: Secondary | ICD-10-CM

## 2012-07-07 DIAGNOSIS — E538 Deficiency of other specified B group vitamins: Secondary | ICD-10-CM

## 2012-07-07 DIAGNOSIS — G629 Polyneuropathy, unspecified: Secondary | ICD-10-CM

## 2012-07-07 DIAGNOSIS — M2550 Pain in unspecified joint: Secondary | ICD-10-CM

## 2012-07-07 DIAGNOSIS — M5137 Other intervertebral disc degeneration, lumbosacral region: Secondary | ICD-10-CM

## 2012-07-07 LAB — CBC WITH DIFFERENTIAL/PLATELET
Basophils Absolute: 0 10*3/uL (ref 0.0–0.1)
HCT: 32.5 % — ABNORMAL LOW (ref 36.0–46.0)
Lymphocytes Relative: 7 % — ABNORMAL LOW (ref 12–46)
Lymphs Abs: 0.7 10*3/uL (ref 0.7–4.0)
Monocytes Absolute: 1.8 10*3/uL — ABNORMAL HIGH (ref 0.1–1.0)
Neutro Abs: 8.3 10*3/uL — ABNORMAL HIGH (ref 1.7–7.7)
Platelets: 256 10*3/uL (ref 150–400)
RBC: 3.39 MIL/uL — ABNORMAL LOW (ref 3.87–5.11)
RDW: 13.8 % (ref 11.5–15.5)
WBC: 10.9 10*3/uL — ABNORMAL HIGH (ref 4.0–10.5)

## 2012-07-07 NOTE — Progress Notes (Signed)
Subjective:    Patient ID: [name], female    DOB: 01-12-1941, 71 y.o.   MRN: [id number]  HPI  This 71 y.o. Caus female had a fall at home and was evaluated at [facility] yesterday.  She is here for scheduled visit and recheck of left elbow laceration and further evaluation; she has   Type II DM treated with Metformin. She has also has hyponatremia which is being monitored. She  has had several falls in and around the home; husband notes that their home  Does pose some  challenges (cluttered?) and he has been trying to address this. The pt says she tripped on a step-down  area near the dining table most recently. She has a hx of lumbar deg disc disease (s/p surgery) and  has ongoing LBP and hip pain. She reports weakness in legs and feels unsteady in general.    She has a follow-up for suture removal in 10 days (at [facility]?).    Review of Systems  Constitutional: Positive for activity change and fatigue. Negative for fever, appetite change and unexpected weight change.  Musculoskeletal: Positive for back pain, arthralgias and gait problem.  Neurological: Positive for weakness and numbness.  Psychiatric/Behavioral: Positive for confusion.       Objective:   Physical Exam  Nursing note and vitals reviewed. Constitutional: She is oriented to person, place, and time. She appears well-developed and well-nourished. No distress.       Appears chronically ill  HENT:  Head: Normocephalic and atraumatic.  Eyes: Conjunctivae normal and EOM are normal. No scleral icterus.  Cardiovascular: Normal rate, regular rhythm and normal heart sounds.   Pulmonary/Chest: Effort normal and breath sounds normal. No respiratory distress.  Musculoskeletal:       Left arm/ elbow: wound sutured with well-approximate edges. No signs of infection. Edema and ecchymosis proximal and distal to joint. Tender to light touch. Good ROM in elbow and pt not wearing wrist splint- no c/o wrist pain; no deformity  noted.  Neurological: She is alert and oriented to person, place, and time. No cranial nerve deficit. She exhibits normal muscle tone.       Gait: abnormal without normal arm swing and wide-legged. "Get up and go" test abnormal. Lower ext weakness noted. Pt cannot bear weight on one leg at a time.  Psychiatric: Judgment and thought content normal. Her mood appears anxious. Her affect is not labile and not inappropriate. Her speech is delayed. She is slowed. Cognition and memory are impaired. She exhibits a depressed mood.      [facility] reading (PRIMARY) by  Dr. Audria Nine: Lumbar spine- multilevel degenerative changes (pt has  a hx of Lumbar surgery)       Assessment & Plan:   1. DDD (degenerative disc disease), lumbar  Ambulatory referral to Physical Therapy, DG Lumbar Spine 2-3 Views  2. Weakness of both legs - pt needs assistance with gait and falls prevention. Also need to review medications for possible "adverse effects" contributing to frequent falls Ambulatory referral to Physical Therapy Pt uses a simple cane to ambulate; she would benefit from a quad cane or a rolling walker (PT can address this) Offered In-Home assessment to pt and husband re: Safety Issues. Husband declines at this time; he states he will works on removing obvious hazards from the home.  3. Anemia  CBC with Differential  4. Hyponatremia  Basic metabolic panel  5. Neuropathy  Vitamin B12 Gabapentin dosing- D/C 4x daily dosing as this med  may be causing balance problems; pt to take 1 capsule in afternoon and 1 capsule at bedtime.  6. Vitamin B12 deficiency  Vitamin B12  7. Joint pain - pt/ husband state that pt was diagnosed with RA in past. Rheumatoid factor

## 2012-07-07 NOTE — Patient Instructions (Signed)
GABAPENTIN- This medication may be causing some problems with balance/ gait, etc. Reduce the dose to 1 capsule in the late afternoon and 1 capsule at bedtime.

## 2012-07-08 LAB — BASIC METABOLIC PANEL
CO2: 25 mEq/L (ref 19–32)
Chloride: 83 mEq/L — ABNORMAL LOW (ref 96–112)
Potassium: 4.5 mEq/L (ref 3.5–5.3)

## 2012-07-08 LAB — RHEUMATOID FACTOR: Rhuematoid fact SerPl-aCnc: <10 IU/mL (ref <=14)

## 2012-07-10 ENCOUNTER — Encounter: Payer: Self-pay | Admitting: Family Medicine

## 2012-07-10 DIAGNOSIS — R296 Repeated falls: Secondary | ICD-10-CM | POA: Insufficient documentation

## 2012-07-18 ENCOUNTER — Telehealth: Payer: Self-pay

## 2012-07-18 ENCOUNTER — Ambulatory Visit (INDEPENDENT_AMBULATORY_CARE_PROVIDER_SITE_OTHER): Payer: Medicare Other | Admitting: Internal Medicine

## 2012-07-18 VITALS — BP 120/76 | HR 67 | Temp 98.0°F | Resp 16 | Ht 64.0 in | Wt 153.6 lb

## 2012-07-18 DIAGNOSIS — M1610 Unilateral primary osteoarthritis, unspecified hip: Secondary | ICD-10-CM

## 2012-07-18 NOTE — Telephone Encounter (Signed)
Message copied by Johnnette Litter on Mon Jul 18, 2012  6:59 PM ------      Message from: Tonye Pearson      Created: Mon Jul 18, 2012  6:17 PM       What happened to PT referral

## 2012-07-18 NOTE — Progress Notes (Signed)
  Subjective:    Patient ID: [name], female    DOB: July 17, 1941, 71 y.o.   MRN: [id number]  HPIhere for suture removal and for eval of hip pain See last visits-fall w/ injuries and laceration L elbow Better but R hip continues to be painful enough to affect gait and to bother her at night This preceded recent injury from falling and has been present for 3-4 months   Complicated med hx: Patient Active Problem List  Diagnosis  . Type II diabetes mellitus  . HTN (hypertension)  . Depression with anxiety  . Hypercholesteremia  . Chronic pain syndrome  . Lumbar postlaminectomy syndrome  . Lumbar spondylosis  . Diabetic neuropathy, type II diabetes mellitus  . Frequent falls  has been resistant to advice to use cane or walker   Review of Systems No new sxtoms    Objective:   Physical Exam Filed Vitals:   07/18/12 1753  BP: 120/76  Pulse: 67  Temp: 98 F (36.7 C)  Resp: 16  NAD Wound L olecranon area well healed eccymoses all along L forearm but intact function L wrist w/ eccymoses but good ROM and grip  R hip w/ pain on int and ext rotation and limited rom due to pain Is lender over olecranon area and into lateral thigh Also tender in R gluteal area Gait antalgic  mild DJD of both hips- x-rays done 9/19/ 2013  Sutures removed    Assessment & Plan:   1. Arthritis pain, hip  Ambulatory referral to Orthopedic Surgery  she declines walking aides again Has meds for pain

## 2012-07-19 ENCOUNTER — Ambulatory Visit: Payer: Medicare Other | Admitting: Cardiovascular Disease

## 2012-07-21 ENCOUNTER — Ambulatory Visit (INDEPENDENT_AMBULATORY_CARE_PROVIDER_SITE_OTHER): Payer: Medicare Other | Admitting: Family Medicine

## 2012-07-21 VITALS — BP 168/79 | HR 62 | Temp 96.6°F | Resp 16 | Ht 64.0 in | Wt 157.0 lb

## 2012-07-21 DIAGNOSIS — Z9181 History of falling: Secondary | ICD-10-CM

## 2012-07-21 DIAGNOSIS — E871 Hypo-osmolality and hyponatremia: Secondary | ICD-10-CM

## 2012-07-21 DIAGNOSIS — Z23 Encounter for immunization: Secondary | ICD-10-CM

## 2012-07-21 DIAGNOSIS — E119 Type 2 diabetes mellitus without complications: Secondary | ICD-10-CM

## 2012-07-21 NOTE — Progress Notes (Signed)
S; This 71 y.o. Cauc female is here with her husband to review labs and address ongoing unsteadiness of gait. She had sutures removed from left elbow on 07/18/12. Husband says pt is still having trouble with hip pain; they have not heard from PT  about evaluation. She has been referred to Ortho for evaluation of hip pain/ gait abnormality. She has a hx of lumbar spine surgery in remote past.   Re: Hyponatremia- pt has been taking reduced dose of Metformin. She takes Paxil and Effexor, both prescribed years ago for depression and anxiety (after returning from [place] extremely ill and having emotional difficulties as well).   Husband and pt are working on making home safer and removing any items (i.e. throw rugs, etc.) that could contribute to falls.  O: Filed Vitals:   07/21/12 1321  BP: 168/79                                     Weight up 3 lbs from Aug visit  Pulse: 62  Temp: 96.6 F (35.9 C)  Resp: 16   GEN: In NAD; WN,WD. HENT: [place]/AT; EOMI, conj/scl clear. COR:RRR, no edema. LUNGS: Normal resp rate and effort.   SKIN: Left elbow- edges of wound well approximated without signs of infection. Tissue edema noted. NEURO: A&O x 3; CNs intact. Gait without assistance is slightly unsteady and wide-based.   Results for orders placed in visit on 07/21/12  POCT GLYCOSYLATED HEMOGLOBIN (HGB A1C)      Component Value Range   Hemoglobin A1C 5.2       A/P: 1. Type II or unspecified type diabetes mellitus without mention of complication, not stated as uncontrolled  Reduce Metformin to 1/2 tablet with AM meal and 1/2 tablet with PM meal.  2. Hyponatremia  Reduce Paroxetine dose to 1/2 tablet every AM; plan is to discontinue this medication Recheck BMET at next visit.  3. Risk for falls - couple advised to remove throw rugs and other hazards from home ORTHO and PT referrals pending  4. Need for prophylactic vaccination and inoculation against influenza  Flu vaccine greater than or equal to 3yo  preservative free IM

## 2012-07-21 NOTE — Patient Instructions (Addendum)
You still have a low sodium. I want you take Paroxetine (Paxil) 20 mg  1/2 tablet every AM; this medication can cause low sodium. Metformin dose has been changed: Take 1/2 tablet in the AM and 1/2 tablet in the PM. This may help with the low sodium problem. Take your Vitamin B12 every day.

## 2012-07-22 ENCOUNTER — Encounter: Payer: Self-pay | Admitting: Cardiovascular Disease

## 2012-07-24 ENCOUNTER — Encounter: Payer: Self-pay | Admitting: Family Medicine

## 2012-08-01 ENCOUNTER — Other Ambulatory Visit: Payer: Self-pay | Admitting: *Deleted

## 2012-08-01 MED ORDER — GABAPENTIN 300 MG PO CAPS: 300.0000 mg | ORAL_CAPSULE | Freq: Four times a day (QID) | ORAL | Status: DC

## 2012-08-03 ENCOUNTER — Telehealth: Payer: Self-pay | Admitting: Physical Medicine & Rehabilitation

## 2012-08-03 MED ORDER — HYDROCODONE-ACETAMINOPHEN 5-325 MG PO TABS: 1.0000 | ORAL_TABLET | Freq: Three times a day (TID) | ORAL | Status: DC | PRN

## 2012-08-03 NOTE — Telephone Encounter (Signed)
Refill on Hydrocodone 

## 2012-08-03 NOTE — Telephone Encounter (Signed)
Rx has been called in, pt husband aware.

## 2012-08-16 ENCOUNTER — Ambulatory Visit: Payer: Medicare Other | Admitting: Physical Medicine & Rehabilitation

## 2012-08-16 ENCOUNTER — Encounter
Payer: Medicare Other | Attending: Physical Medicine and Rehabilitation | Admitting: Physical Medicine and Rehabilitation

## 2012-08-16 ENCOUNTER — Encounter: Payer: Self-pay | Admitting: Physical Medicine and Rehabilitation

## 2012-08-16 VITALS — BP 191/88 | HR 60 | Resp 14 | Ht 64.0 in | Wt 157.0 lb

## 2012-08-16 DIAGNOSIS — M961 Postlaminectomy syndrome, not elsewhere classified: Secondary | ICD-10-CM | POA: Insufficient documentation

## 2012-08-16 DIAGNOSIS — I10 Essential (primary) hypertension: Secondary | ICD-10-CM | POA: Insufficient documentation

## 2012-08-16 DIAGNOSIS — M545 Low back pain, unspecified: Secondary | ICD-10-CM | POA: Insufficient documentation

## 2012-08-16 DIAGNOSIS — Z5181 Encounter for therapeutic drug level monitoring: Secondary | ICD-10-CM

## 2012-08-16 DIAGNOSIS — E1149 Type 2 diabetes mellitus with other diabetic neurological complication: Secondary | ICD-10-CM | POA: Insufficient documentation

## 2012-08-16 DIAGNOSIS — R279 Unspecified lack of coordination: Secondary | ICD-10-CM | POA: Insufficient documentation

## 2012-08-16 DIAGNOSIS — E1142 Type 2 diabetes mellitus with diabetic polyneuropathy: Secondary | ICD-10-CM | POA: Insufficient documentation

## 2012-08-16 DIAGNOSIS — M25559 Pain in unspecified hip: Secondary | ICD-10-CM

## 2012-08-16 DIAGNOSIS — E785 Hyperlipidemia, unspecified: Secondary | ICD-10-CM | POA: Insufficient documentation

## 2012-08-16 DIAGNOSIS — R209 Unspecified disturbances of skin sensation: Secondary | ICD-10-CM | POA: Insufficient documentation

## 2012-08-16 NOTE — Progress Notes (Signed)
Subjective:    Patient ID: [name], female    DOB: [birth year], 71 y.o.   MRN: [id number]  HPI The patient complains about chronic low back  pain which radiates into her LE bilateral, posteriorly.  The patient also complains about numbness and tingling in both of her entire feet. The problem has been stable.  She underwent right L2-3-4 radiofrequency September 24, 2011. Results  have been okay, but not as good as before. Her previous RF was  performed January 29, 2011, on the right side. She had an episode where  she had some numbness in the left arm and left leg when she was standing  at the sink lasted no more than 4 minutes. She had no loss of function  in her upper extremity. She has had no prior episodes that are similar.  She has been told by her cardiologist Dr. Elease Hashimoto that she should be  taking a baby aspirin a day, but has not been doing so. This has  resolved. No recurrence.   Pain Inventory Average Pain 5 Pain Right Now 4 My pain is intermittent, dull and aching  In the last 24 hours, has pain interfered with the following? General activity 6 Relation with others 0 Enjoyment of life 6 What TIME of day is your pain at its worst? evening Sleep (in general) Good  Pain is worse with: bending Pain improves with: rest and medication Relief from Meds: 6  Mobility walk without assistance how many minutes can you walk? 20 ability to climb steps?  no do you drive?  no  Function retired I need assistance with the following:  household duties and shopping  Neuro/Psych weakness numbness trouble walking depression anxiety  Prior Studies Any changes since last visit?  no  Physicians involved in your care Any changes since last visit?  no   Family History  Problem Relation Age of Onset  . Asthma Sister   . Allergies Sister   . Diabetes Brother   . Hypertension Brother    History   Social History  . Marital Status: Married    Spouse Name: N/A   Number of Children: N/A  . Years of Education: N/A   Social History Main Topics  . Smoking status: Former Smoker -- 2.0 packs/day for 30 years    Types: Cigarettes    Quit date: 10/19/1990  . Smokeless tobacco: None  . Alcohol Use: None  . Drug Use: None  . Sexually Active: None   Other Topics Concern  . None   Social History Narrative  . None   Past Surgical History  Procedure Date  . Spine surgery     2 back procedures  . Antrectomy     Vagotomy done also for severe peptic ulcer disease  . Vagotomy and pyloroplasty   . Eye surgery     Cataracts   Past Medical History  Diagnosis Date  . Postlaminectomy syndrome, lumbar region   . Disturbance of skin sensation   . Disorders of sacrum   . Hyperlipidemia   . Lumbosacral spondylosis without myelopathy   . Asthma     Situational (environmental triggers)  . Cancer   . Diabetes mellitus   . Depression   . Hypertension   . Ulcer    BP 191/88  Pulse 60  Resp 14  Ht 5\' 4"  (1.626 m)  Wt 157 lb (71.215 kg)  BMI 26.95 kg/m2  SpO2 98%     Review of Systems  Musculoskeletal: Positive for  gait problem.  Neurological: Positive for weakness and numbness.  Psychiatric/Behavioral: Positive for dysphoric mood. The patient is nervous/anxious.   All other systems reviewed and are negative.       Objective:   Physical Exam Constitutional: She is oriented to person, place, and time. She appears well-developed.  Musculoskeletal:  Lumbar back: She exhibits decreased range of motion.  Decreased lumbar extension normal lumbar flexion Straight leg raising test is negative  Neurological: She is alert and oriented to person, place, and time. She has normal strength. A sensory deficit is present.  Numbness in both feet  Psychiatric: Thought content normal. Her affect is normal. Her speech is delayed. She is slowed.She has some difficulties to answer my questions, she most of the time looks to her husband for support to answer  the question, what he then does, most of the time.   Symmetric normal motor tone is noted throughout. Normal muscle bulk. Muscle testing reveals 5/5 muscle strength of the upper extremity, and 5/5 of the lower extremity. Full range of motion in upper and lower extremities. ROM of spine is restricted.  DTR in the upper and lower extremity are present and symmetric 2+, except demenished right patella tendon reflex. No clonus is noted.  Patient arises from chair with difficulty. Wide based gait with decreased arm swing bilateral , able to walk on heels and toes, but difficult to keep balance . Tandem walk is not possible. No pronator drift. Rhomberg positive.        Assessment & Plan:  1. Lumbar postlaminectomy syndrome we'll continue her hydrocodone and increased from 2 tablets per day to 3 tablets per day of the 5 mg dose  2. Diabetic neuropathy. On gabapentin to 300 mg 3 times a day to 4 times a day cautioned on potential drowsiness.  3. Balance disorder, could be coming from diabetic neuropathy, but also positive Rhomberg and slow cognitively, problems with memory.  Advised patient to talk to her PCP, she has an appointment next week, about a possible referral to neurology, to further evaluate her memory and balance problems.   Return in 3 months with PA, or earlier if necessary .

## 2012-08-16 NOTE — Patient Instructions (Signed)
Try to look into whether your insurance supports the silver sneakers program , and whether they would pay for a [employer] Aquatic exercises would be very beneficial for you. Discuss with your PCP a referral to a neurologist, to evaluate your balance and memory problems.

## 2012-08-25 ENCOUNTER — Ambulatory Visit: Payer: Medicare Other | Admitting: Cardiovascular Disease

## 2012-08-25 ENCOUNTER — Ambulatory Visit: Payer: Medicare Other | Admitting: Family Medicine

## 2012-09-01 ENCOUNTER — Ambulatory Visit (INDEPENDENT_AMBULATORY_CARE_PROVIDER_SITE_OTHER): Payer: Medicare Other | Admitting: Family Medicine

## 2012-09-01 ENCOUNTER — Encounter: Payer: Self-pay | Admitting: Family Medicine

## 2012-09-01 VITALS — BP 176/92 | HR 77 | Temp 97.0°F | Resp 22 | Ht 65.0 in | Wt 158.0 lb

## 2012-09-01 DIAGNOSIS — R279 Unspecified lack of coordination: Secondary | ICD-10-CM

## 2012-09-01 DIAGNOSIS — I10 Essential (primary) hypertension: Secondary | ICD-10-CM

## 2012-09-01 DIAGNOSIS — Z87820 Personal history of traumatic brain injury: Secondary | ICD-10-CM

## 2012-09-01 DIAGNOSIS — D649 Anemia, unspecified: Secondary | ICD-10-CM

## 2012-09-01 DIAGNOSIS — E871 Hypo-osmolality and hyponatremia: Secondary | ICD-10-CM

## 2012-09-01 LAB — CBC
HCT: 34.4 % — ABNORMAL LOW (ref 36.0–46.0)
MCH: 31.8 pg (ref 26.0–34.0)
MCHC: 33.1 g/dL (ref 30.0–36.0)
MCV: 95.8 fL (ref 78.0–100.0)
Platelets: 275 10*3/uL (ref 150–400)
RDW: 13.4 % (ref 11.5–15.5)
WBC: 9 10*3/uL (ref 4.0–10.5)

## 2012-09-01 MED ORDER — ALBUTEROL SULFATE HFA 108 (90 BASE) MCG/ACT IN AERS: 2.0000 | INHALATION_SPRAY | Freq: Four times a day (QID) | RESPIRATORY_TRACT | Status: DC | PRN

## 2012-09-01 NOTE — Patient Instructions (Addendum)
You are being scheduled for CT of head to look for any abnormality after head trauma/ concussion this past summer; this may help explain balance problems.  Paroxetine (Paxil) is being discontinued; if you have any tablets, take 1/2  tablet every other day until they are all gone. DO NOT REFILL THIS MEDICATION.  You have an appointment with Dr. Elease Hashimoto next week; I am not going to change your medications at this time.

## 2012-09-02 ENCOUNTER — Encounter: Payer: Self-pay | Admitting: Family Medicine

## 2012-09-02 LAB — BASIC METABOLIC PANEL
BUN: 7 mg/dL (ref 6–23)
CO2: 23 mEq/L (ref 19–32)
Glucose, Bld: 87 mg/dL (ref 70–99)
Potassium: 4.5 mEq/L (ref 3.5–5.3)

## 2012-09-02 NOTE — Progress Notes (Signed)
S: Pt returns with husband for follow-up after visits to Pain Mgmt (Dr. Wynn Banker); the PA who saw the pt on 08/16/12 recognized significant balance problem and thought pt should be eval by neurology. Husband reports no recent falls and he thinks pt's balance is improved. He reports a fall that occurred several months ago; the pt fell at home and struck the back of her head on the floor. She had LOC for 3-4 minutes. No medical attention was sought at that time. Upon further questioning, husband reports pt c/o HA occasionally; no n/v, visual disturbances. No HA at today's visit. Pt sleeps well and has a good appetite. Still having problems with memory.  Pt c/o "tickle" in back of throat that is triggering a nonprod cough, worse at night. She has had mild "asthma-like" symptoms in past, good response to Albuterol MDI.  Pt denies allergy symptoms, CP, SOB, DOE, orthopnea, wheezing.  ROS: As per HPI.  O:  Filed Vitals:   09/01/12 1715  BP: 176/92                                         Initial reading: 199/88  Pulse:       77  Temp:   Resp:       22   GEN: In NAD; WN,WD. HEENT: [place]/AT; EOMI w/ slightly injected conj, nonicteric sclerae. Post ph mildly erythematous. TMs normal. NECK: No LAN, JVD. COR: RRR. LUNGS: CTA w/ decreased BS at bases; no rales or wheezes. NEURO: A&O x 3; Cns intact. Gait mildly ataxic. Mentation- attentive but struggles occasionally with comprehension and memory retrieval (very dependent on husband for help with cognition)   A/P: 1. Anemia - Hgb = 11.1 g/dL in Sept CBC  2. Hyponatremia  Basic metabolic panel Discontinue Paroxetine which may cause low Na+ (pt/husband advised to take 1/2 tab every other day until pills all gone)  3. History of concussion - R/O intra-cranial lesion or SDH CT Head Wo Contrast request that it be sch within next few days  4. Ataxia, subacute  CT Head Wo Contrast  5. HTN (hypertension)  Pt has appt w/ Dr. Elease Hashimoto next week per husband    RX:  Albuterol MDI  2 puffs 4x daily prn cough or SOB.

## 2012-09-02 NOTE — Progress Notes (Signed)
Quick Note:  Please call pt and advise that the following labs are abnormal...  Labs show anemia is stable; sodium is still low but slightly improved from 2 months ago.    Copy to pt. ______

## 2012-09-05 ENCOUNTER — Telehealth: Payer: Self-pay | Admitting: Family Medicine

## 2012-09-05 ENCOUNTER — Ambulatory Visit
Admission: RE | Admit: 2012-09-05 | Discharge: 2012-09-05 | Disposition: A | Discharge status: Home / Self Care | Payer: Medicare Other | Source: Ambulatory Visit | Attending: Family Medicine | Admitting: Family Medicine

## 2012-09-05 DIAGNOSIS — Z87820 Personal history of traumatic brain injury: Secondary | ICD-10-CM

## 2012-09-05 NOTE — Telephone Encounter (Signed)
Pt was sent for CT of head after husband reported a fall w/ LOC and concussion pt suffered in July 2013. Pt has been having some balance problems as well as mild cognitive decline and memory problems. After review of CT results, I contacted husband to give him a brief report and reassure him that pt did not have any abnormality that required immediate action. Will discuss in detail with pt and husband at next visit.

## 2012-09-13 ENCOUNTER — Encounter: Payer: Self-pay | Admitting: Cardiovascular Disease

## 2012-09-13 ENCOUNTER — Ambulatory Visit (INDEPENDENT_AMBULATORY_CARE_PROVIDER_SITE_OTHER): Payer: Medicare Other | Admitting: Cardiovascular Disease

## 2012-09-13 VITALS — BP 140/82 | HR 72 | Ht 65.0 in | Wt 159.4 lb

## 2012-09-13 DIAGNOSIS — I2510 Atherosclerotic heart disease of native coronary artery without angina pectoris: Secondary | ICD-10-CM

## 2012-09-13 DIAGNOSIS — R2689 Other abnormalities of gait and mobility: Secondary | ICD-10-CM

## 2012-09-13 DIAGNOSIS — R29818 Other symptoms and signs involving the nervous system: Secondary | ICD-10-CM

## 2012-09-13 DIAGNOSIS — I10 Essential (primary) hypertension: Secondary | ICD-10-CM

## 2012-09-13 NOTE — Assessment & Plan Note (Signed)
[name] has not had any episodes of angina. We'll continue to follow.

## 2012-09-13 NOTE — Patient Instructions (Addendum)
Please call Maurice March, MD to review your symptoms,  Your physician wants you to follow-up in: 6 months  You will receive a reminder letter in the mail two months in advance. If you don't receive a letter, please call our office to schedule the follow-up appointment.  Your physician recommends that you continue on your current medications as directed. Please refer to the Current Medication list given to you today.  REDUCE HIGH SODIUM FOODS LIKE CANNED SOUP, GRAVY, SAUCES, READY PREPARED FOODS LIKE FROZEN FOODS; LEAN CUISINE, LASAGNA/ PIZZA . BACON, HAM, SAUSAGE, LUNCH MEAT, FAST FOODS.Marland Kitchen  THIS WILL HELP LOWER YOUR BLOOD PRESSURE WITHOUT ADDING MORE MEDICATION.

## 2012-09-13 NOTE — Assessment & Plan Note (Signed)
BP is a little elevated.  I have asked her to cut back on her salt.  I do not want to increase her BP meds at this point because of her neuro issues.

## 2012-09-13 NOTE — Assessment & Plan Note (Signed)
[name] seems to have some neurologic difficulties at present. Her gait is shuffling. She is unbalanced when she stands. She has lost a lot of fine motor control. Her affect is flat. I suspect that she may have Parkinson's disease or perhaps a similar syndrome.  I have asked her to call her medical doctor and get a referral to neurology.  These symptoms also could be due to chronic narcotic use. I would like for the neurologist away in on that issue.

## 2012-09-13 NOTE — Progress Notes (Signed)
Heywood Footman Date of Birth  1941/04/24 [facility]     [facility]  [address]    [address]   [address] [place], [place]  [address]    [place], [place]  [address] [phone]  Fax  [phone]  [phone]  Fax [phone]   Problems: 1. Mild to moderate coronary artery disease 2. Hypertension 3. Diabetes mellitus 4. History of GI bleed 5. Anemia 6. Frequent falls -  "balance issues"    History of Present Illness:  Pt is doing well.  No episodes of chest pain.  She had some left leg and left face numbness.  She's not had any cardiac complaints.  We started her on Bystolic during her last visit.  Her BP at home have been moderately elevated.    She has occasional episodes of frequent falling due to balance issues.     Current Outpatient Prescriptions on File Prior to Visit  Medication Sig Dispense Refill  . acetaminophen-codeine (TYLENOL #3) 300-30 MG per tablet Take 1 tablet by mouth every 4 (four) hours as needed.      Marland Kitchen albuterol (PROVENTIL HFA;VENTOLIN HFA) 108 (90 BASE) MCG/ACT inhaler Inhale 2 puffs into the lungs every 6 (six) hours as needed for wheezing.  1 Inhaler  3  . ALPRAZolam (XANAX) 0.5 MG tablet Take 0.5 mg by mouth daily as needed.      Marland Kitchen CALCIUM PO Take by mouth.      . cholecalciferol (VITAMIN D) 1000 UNITS tablet Take by mouth daily.      Marland Kitchen DOCUSATE CALCIUM PO Take by mouth.      Marland Kitchen HYDROcodone-acetaminophen (NORCO/VICODIN) 5-325 MG per tablet Take 1 tablet by mouth every 8 (eight) hours as needed for pain.  90 tablet  0  . lisinopril (PRINIVIL,ZESTRIL) 10 MG tablet Take 10 mg by mouth daily.      . meloxicam (MOBIC) 15 MG tablet Take 15 mg by mouth daily.      . metFORMIN (GLUCOPHAGE) 500 MG tablet Take 500 mg by mouth 2 (two) times daily.      . Multiple Vitamins-Minerals (MULTIVITAMIN PO) Take by mouth.      . nebivolol (BYSTOLIC) 10 MG tablet Take 1 tablet (10 mg total) by mouth daily.  30 tablet  5  . Omega-3 Fatty  Acids (FISH OIL PO) Take by mouth.      . promethazine (PHENERGAN) 25 MG tablet Take 25 mg by mouth every 6 (six) hours as needed.      . simvastatin (ZOCOR) 80 MG tablet Take 40 mg by mouth daily.       Marland Kitchen venlafaxine (EFFEXOR-XR) 75 MG 24 hr capsule Take 37.5 mg by mouth 2 (two) times daily.       Marland Kitchen VITAMIN A PO Take 8,000 Units by mouth.      . [DISCONTINUED] gabapentin (NEURONTIN) 300 MG capsule Take 1 capsule (300 mg total) by mouth 4 (four) times daily.  120 capsule  2    Allergies  Allergen Reactions  . Gentamicin   . Polymyxin B   . Mupirocin Rash  . Pneumococcal Vaccines Rash    Past Medical History  Diagnosis Date  . Postlaminectomy syndrome, lumbar region   . Disturbance of skin sensation   . Disorders of sacrum   . Hyperlipidemia   . Lumbosacral spondylosis without myelopathy   . Asthma     Situational (environmental triggers)  . Cancer   . Diabetes mellitus   . Depression   .  Hypertension   . Ulcer     Past Surgical History  Procedure Date  . Spine surgery     2 back procedures  . Antrectomy     Vagotomy done also for severe peptic ulcer disease  . Vagotomy and pyloroplasty   . Eye surgery     Cataracts    History  Smoking status  . Former Smoker -- 2.0 packs/day for 30 years  . Types: Cigarettes  . Quit date: 10/19/1990  Smokeless tobacco  . Not on file    History  Alcohol Use: Not on file    Family History  Problem Relation Age of Onset  . Asthma Sister   . Allergies Sister   . Diabetes Brother   . Hypertension Brother     Reviw of Systems:  Reviewed in the HPI.  All other systems are negative.  Physical Exam: Blood pressure 144/76, pulse 72, height 5\' 5"  (1.651 m), weight 159 lb 6.4 oz (72.303 kg), SpO2 99.00%. General: Well developed, well nourished.  She has slowed down significantly   Head: Normocephalic, atraumatic, sclera non-icteric, mucus membranes are moist,   Neck: Supple. Carotids are 2 + without bruits. No  JVD  Lungs: Clear bilaterally to auscultation.  Heart: regular rate.  normal  S1 S2. No murmurs, gallops or rubs.  Abdomen: Soft, non-tender, non-distended with normal bowel sounds. No hepatomegaly. No rebound/guarding. No masses.  Msk:  Strength and tone are normal  Extremities: No clubbing or cyanosis. No edema.  Distal pedal pulses are 2+ and equal bilaterally.  Neuro: Alert and oriented X 3.  Her movements are slow.  Shuffling gait.    Fine motor control of fingers is slow.  Unsteady when standing.  Seems to be forgetful.  Asked her husband several times for verification of past events.  Psych:  Flat affect.  ECG: 01/19/2012-normal sinus rhythm at 70 beats per minute. Normal EKG.  Assessment / Plan:

## 2012-09-30 ENCOUNTER — Ambulatory Visit: Payer: Self-pay | Admitting: Family Medicine

## 2012-10-04 ENCOUNTER — Ambulatory Visit (INDEPENDENT_AMBULATORY_CARE_PROVIDER_SITE_OTHER): Payer: Medicare Other | Admitting: Family Medicine

## 2012-10-04 ENCOUNTER — Encounter: Payer: Self-pay | Admitting: Family Medicine

## 2012-10-04 VITALS — BP 120/80 | HR 60 | Temp 98.3°F | Resp 16 | Ht 64.0 in | Wt 158.8 lb

## 2012-10-04 DIAGNOSIS — R4189 Other symptoms and signs involving cognitive functions and awareness: Secondary | ICD-10-CM

## 2012-10-04 DIAGNOSIS — E871 Hypo-osmolality and hyponatremia: Secondary | ICD-10-CM

## 2012-10-04 DIAGNOSIS — R419 Unspecified symptoms and signs involving cognitive functions and awareness: Secondary | ICD-10-CM

## 2012-10-04 DIAGNOSIS — Z79899 Other long term (current) drug therapy: Secondary | ICD-10-CM

## 2012-10-04 DIAGNOSIS — R269 Unspecified abnormalities of gait and mobility: Secondary | ICD-10-CM

## 2012-10-04 DIAGNOSIS — E559 Vitamin D deficiency, unspecified: Secondary | ICD-10-CM

## 2012-10-04 LAB — BASIC METABOLIC PANEL
Calcium: 9 mg/dL (ref 8.4–10.5)
Creat: 0.84 mg/dL (ref 0.50–1.10)
Glucose, Bld: 93 mg/dL (ref 70–99)
Sodium: 123 mEq/L — ABNORMAL LOW (ref 135–145)

## 2012-10-04 NOTE — Patient Instructions (Addendum)
I have referred you to a Neurologist for evaluation of cognitive problems/ memory decline and gait disturbance.  Your next visit with me will be in 3 months for Diabetes follow-up.

## 2012-10-05 ENCOUNTER — Ambulatory Visit: Payer: Medicare Other

## 2012-10-05 ENCOUNTER — Other Ambulatory Visit: Payer: Self-pay | Admitting: Family Medicine

## 2012-10-05 DIAGNOSIS — E871 Hypo-osmolality and hyponatremia: Secondary | ICD-10-CM

## 2012-10-05 LAB — VITAMIN D 25 HYDROXY (VIT D DEFICIENCY, FRACTURES): Vit D, 25-Hydroxy: 20 ng/mL — ABNORMAL LOW (ref 30–89)

## 2012-10-05 MED ORDER — VITAMIN D (ERGOCALCIFEROL) 1.25 MG (50000 UNIT) PO CAPS: ORAL_CAPSULE | ORAL | Status: DC

## 2012-10-05 NOTE — Progress Notes (Signed)
Quick Note:  Please call pt and advise that the following labs are abnormal... Sodium is still below normal but a little better than 3 months ago. I would like for you to have a chest xray (last one March 2011 was normal) as part of evaluation of low sodium. You can get this done at [address] ( I have placed an order for this to be done before the end of this year).  Vitamin D level is lower than 7 months ago. I am prescribing Vitamin D 50,000 units to be taken once a week. Pick this up from your pharmacy and start taking it once a week.   Copy to pt. ______

## 2012-10-06 ENCOUNTER — Encounter: Payer: Self-pay | Admitting: Radiology

## 2012-10-06 NOTE — Progress Notes (Signed)
S:  This 71 y.o. Cauc female returns for ongoing evaluation. She has completed PT and was evaluated by Dr. Melburn Popper on 09/13/12. He noted her gait disturbance and briefly discussed w/ pt /husband consideration of a neurological evaluation. He mentioned "Parkinson's disease" to the couple; reviewing pt's hx of falls and cognitive decline fits this picture. The husband reports a family hx of Parkinson's. Since last visit, she has not fallen. Memory issues remain, especially  with short-term; her long-term memory seems intact. The husband says she asks repeatedly about appointments or something they may have already discussed. Her appetite remains good and she sleeps restfully.  We reviewed the medications and removed all meds that pt is not taking.  ROS: As per HPI.  O:  Filed Vitals:   10/04/12 1637  BP: 120/80                                            Weight is stable.  Pulse: 60  Temp: 98.3 F (36.8 C)  Resp: 16   GEN: In NAD: WN.WD. HENT: [place]/AT. EOMI w/ clear conj and sclerae. EACs/TMs normal. Oroph w/ moist mucosa. NECK: No LAN or TMG. COR: RRR w/o m/g/r. LUNGS: CTA; no wheezes or rales. NEURO: A&O x 3; CNs intact. Facies- expressionless/ mask-like. Motor- 3+-4/5. Sensory intact.                 Gait is shuffling, absent arm swing.  Fine tremor noted at rest when arms are extended. Grip in normal.    A/P:  1. Cognitive complaints  Ambulatory referral to Neurology  2. Gait disturbance - R/O Parkinsonian disorder Ambulatory referral to Neurology  3. Chronic hyponatremia  Basic metabolic panel  4. Unspecified vitamin D deficiency - husband reports pt not taking Vit D supplement Vitamin D, 25-hydroxy  5. Encounter for medication review  Paxil and Tylenol w/ codiene discontinued; some supplements discontinued

## 2012-10-10 ENCOUNTER — Other Ambulatory Visit: Payer: Self-pay | Admitting: *Deleted

## 2012-10-10 MED ORDER — HYDROCODONE-ACETAMINOPHEN 5-325 MG PO TABS: 1.0000 | ORAL_TABLET | Freq: Three times a day (TID) | ORAL | Status: DC | PRN

## 2012-10-20 ENCOUNTER — Ambulatory Visit: Payer: Medicare Other | Admitting: Family Medicine

## 2012-11-15 ENCOUNTER — Other Ambulatory Visit: Payer: Self-pay | Admitting: *Deleted

## 2012-11-15 ENCOUNTER — Encounter: Payer: Medicare Other | Admitting: Physical Medicine and Rehabilitation

## 2012-11-15 MED ORDER — HYDROCODONE-ACETAMINOPHEN 5-325 MG PO TABS: 1.0000 | ORAL_TABLET | Freq: Three times a day (TID) | ORAL | Status: DC | PRN

## 2012-11-18 ENCOUNTER — Other Ambulatory Visit: Payer: Self-pay | Admitting: Family Medicine

## 2012-11-18 NOTE — Telephone Encounter (Signed)
Needs office visit and labs.

## 2012-11-23 ENCOUNTER — Encounter: Payer: Medicare Other | Admitting: Physical Medicine and Rehabilitation

## 2012-11-29 ENCOUNTER — Ambulatory Visit: Payer: Medicare Other | Admitting: Physical Medicine and Rehabilitation

## 2012-11-30 ENCOUNTER — Encounter: Payer: Medicare Other | Admitting: Physical Medicine and Rehabilitation

## 2012-12-07 ENCOUNTER — Encounter
Payer: Medicare Other | Attending: Physical Medicine and Rehabilitation | Admitting: Physical Medicine and Rehabilitation

## 2012-12-07 ENCOUNTER — Encounter: Payer: Self-pay | Admitting: Physical Medicine and Rehabilitation

## 2012-12-07 VITALS — BP 166/77 | HR 70 | Resp 14 | Ht 64.0 in | Wt 157.0 lb

## 2012-12-07 DIAGNOSIS — R29818 Other symptoms and signs involving the nervous system: Secondary | ICD-10-CM

## 2012-12-07 DIAGNOSIS — R279 Unspecified lack of coordination: Secondary | ICD-10-CM | POA: Insufficient documentation

## 2012-12-07 DIAGNOSIS — M545 Low back pain, unspecified: Secondary | ICD-10-CM | POA: Insufficient documentation

## 2012-12-07 DIAGNOSIS — M79609 Pain in unspecified limb: Secondary | ICD-10-CM | POA: Insufficient documentation

## 2012-12-07 DIAGNOSIS — M961 Postlaminectomy syndrome, not elsewhere classified: Secondary | ICD-10-CM | POA: Insufficient documentation

## 2012-12-07 DIAGNOSIS — E1149 Type 2 diabetes mellitus with other diabetic neurological complication: Secondary | ICD-10-CM | POA: Insufficient documentation

## 2012-12-07 DIAGNOSIS — R2689 Other abnormalities of gait and mobility: Secondary | ICD-10-CM

## 2012-12-07 DIAGNOSIS — E1142 Type 2 diabetes mellitus with diabetic polyneuropathy: Secondary | ICD-10-CM | POA: Insufficient documentation

## 2012-12-07 DIAGNOSIS — M47817 Spondylosis without myelopathy or radiculopathy, lumbosacral region: Secondary | ICD-10-CM

## 2012-12-07 DIAGNOSIS — G8929 Other chronic pain: Secondary | ICD-10-CM | POA: Insufficient documentation

## 2012-12-07 NOTE — Progress Notes (Signed)
Subjective:    Patient ID: [name], female    DOB: May 10, 1941, 72 y.o.   MRN: [id number]  HPI The patient complains about chronic low back pain which radiates into her LE bilateral, posteriorly. The patient also complains about numbness and tingling in both of her entire feet.  The problem has been stable.  She underwent right L2-3-4 radiofrequency September 24, 2011. Results  have been okay, but not as good as before. Her previous RF was  performed January 29, 2011, on the right side. She had an episode where  she had some numbness in the left arm and left leg when she was standing  at the sink lasted no more than 4 minutes. She had no loss of function  in her upper extremity. She has had no prior episodes that are similar.  She has been told by her cardiologist Dr. Elease Hashimoto that she should be  taking a baby aspirin a day, but has not been doing so. This has  resolved. No recurrence.   Pain Inventory  Average Pain 5  Pain Right Now 4  My pain is intermittent, dull and aching  In the last 24 hours, has pain interfered with the following?  General activity 6  Relation with others 0  Enjoyment of life 6  What TIME of day is your pain at its worst? evening  Sleep (in general) Good  Pain is worse with: bending  Pain improves with: rest and medication  Relief from Meds: 6  Mobility  walk without assistance  how many minutes can you walk? 20  ability to climb steps? no  do you drive? no  Function  retired  I need assistance with the following: household duties and shopping  Neuro/Psych  weakness  numbness  trouble walking  depression  anxiety  Prior Studies  Any changes since last visit? no  Physicians involved in your care  Any changes since last visit? no   Review of Systems  Musculoskeletal: Positive for back pain and gait problem.  All other systems reviewed and are negative.       Objective:   Physical Exam Constitutional: She is oriented to person, place,  and time. She appears well-developed.  Musculoskeletal:  Lumbar back: She exhibits decreased range of motion.  Decreased lumbar extension normal lumbar flexion Straight leg raising test is negative  Neurological: She is alert and oriented to person, place, and time. She has normal strength. A sensory deficit is present.  Numbness in both feet  Psychiatric: Thought content normal. Her affect is normal. Her speech is delayed. She is slowed.She has some difficulties to answer my questions, she most of the time looks to her husband for support to answer the question, what he then does, most of the time.  Symmetric normal motor tone is noted throughout. Normal muscle bulk. Muscle testing reveals 5/5 muscle strength of the upper extremity, and 5/5 of the lower extremity. Full range of motion in upper and lower extremities. ROM of spine is restricted.  DTR in the upper and lower extremity are present and symmetric 2+, except demenished right patella tendon reflex. No clonus is noted.  Patient arises from chair with difficulty. Wide based gait with decreased arm swing bilateral , able to walk on heels and toes, but difficult to keep balance . Tandem walk is not possible. No pronator drift. Rhomberg positive.        Assessment & Plan:  1. Lumbar postlaminectomy syndrome we'll continue her hydrocodone and increased from  2 tablets per day to 3 tablets per day of the 5 mg dose  2. Diabetic neuropathy. On gabapentin to 300 mg 3 times a day to 4 times a day cautioned on potential drowsiness.  3. Balance disorder, could be coming from diabetic neuropathy, but also positive Rhomberg and slow cognitively, problems with memory. Patient went to Dr. Danae Orleans, neurologist, after I advised patient to talk to her PCP about a possible referral to neurology, to further evaluate her memory and balance problems. He ordered a MRI of her C-spine and L-spine, she was diagnosed with spinal stenosis, per patient. Patient will  talk to Dr. Danae Orleans to sent [place] a copy of the MRI report. Consider referral to neurosurgery in the future . Advised patient to try aquatic exercising, to work on improving her balance, showed her some exercises how to do this in a safe way. Return in 3 months with PA, or earlier if necessary .

## 2012-12-20 ENCOUNTER — Inpatient Hospital Stay (HOSPITAL_COMMUNITY)
Admission: EM | Admit: 2012-12-20 | Discharge: 2012-12-21 | DRG: 641 | Disposition: A | Discharge status: Home / Self Care | Payer: Medicare Other | Attending: Internal Medicine | Admitting: Internal Medicine

## 2012-12-20 ENCOUNTER — Encounter (HOSPITAL_COMMUNITY): Payer: Self-pay

## 2012-12-20 ENCOUNTER — Emergency Department (HOSPITAL_COMMUNITY): Payer: Medicare Other

## 2012-12-20 ENCOUNTER — Inpatient Hospital Stay (HOSPITAL_COMMUNITY): Payer: Medicare Other

## 2012-12-20 DIAGNOSIS — E119 Type 2 diabetes mellitus without complications: Secondary | ICD-10-CM | POA: Diagnosis present

## 2012-12-20 DIAGNOSIS — E538 Deficiency of other specified B group vitamins: Secondary | ICD-10-CM | POA: Diagnosis present

## 2012-12-20 DIAGNOSIS — R4182 Altered mental status, unspecified: Secondary | ICD-10-CM

## 2012-12-20 DIAGNOSIS — Z859 Personal history of malignant neoplasm, unspecified: Secondary | ICD-10-CM

## 2012-12-20 DIAGNOSIS — I10 Essential (primary) hypertension: Secondary | ICD-10-CM | POA: Diagnosis present

## 2012-12-20 DIAGNOSIS — I2510 Atherosclerotic heart disease of native coronary artery without angina pectoris: Secondary | ICD-10-CM | POA: Diagnosis present

## 2012-12-20 DIAGNOSIS — M545 Low back pain, unspecified: Secondary | ICD-10-CM | POA: Diagnosis present

## 2012-12-20 DIAGNOSIS — Z87891 Personal history of nicotine dependence: Secondary | ICD-10-CM

## 2012-12-20 DIAGNOSIS — F10229 Alcohol dependence with intoxication, unspecified: Secondary | ICD-10-CM | POA: Diagnosis present

## 2012-12-20 DIAGNOSIS — E1142 Type 2 diabetes mellitus with diabetic polyneuropathy: Secondary | ICD-10-CM | POA: Diagnosis present

## 2012-12-20 DIAGNOSIS — F418 Other specified anxiety disorders: Secondary | ICD-10-CM | POA: Diagnosis present

## 2012-12-20 DIAGNOSIS — E7800 Pure hypercholesterolemia, unspecified: Secondary | ICD-10-CM | POA: Diagnosis present

## 2012-12-20 DIAGNOSIS — E871 Hypo-osmolality and hyponatremia: Principal | ICD-10-CM | POA: Diagnosis present

## 2012-12-20 DIAGNOSIS — E1149 Type 2 diabetes mellitus with other diabetic neurological complication: Secondary | ICD-10-CM | POA: Diagnosis present

## 2012-12-20 DIAGNOSIS — E785 Hyperlipidemia, unspecified: Secondary | ICD-10-CM | POA: Diagnosis present

## 2012-12-20 DIAGNOSIS — F329 Major depressive disorder, single episode, unspecified: Secondary | ICD-10-CM | POA: Diagnosis present

## 2012-12-20 DIAGNOSIS — E860 Dehydration: Secondary | ICD-10-CM

## 2012-12-20 DIAGNOSIS — G8929 Other chronic pain: Secondary | ICD-10-CM | POA: Diagnosis present

## 2012-12-20 DIAGNOSIS — F3289 Other specified depressive episodes: Secondary | ICD-10-CM | POA: Diagnosis present

## 2012-12-20 LAB — BASIC METABOLIC PANEL
BUN: 7 mg/dL (ref 6–23)
CO2: 20 mEq/L (ref 19–32)
Calcium: 8.2 mg/dL — ABNORMAL LOW (ref 8.4–10.5)
Calcium: 8.8 mg/dL (ref 8.4–10.5)
Creatinine, Ser: 1.1 mg/dL (ref 0.50–1.10)
Creatinine, Ser: 0.92 mg/dL (ref 0.50–1.10)
GFR calc non Af Amer: 62 mL/min — ABNORMAL LOW (ref >90)
Glucose, Bld: 133 mg/dL — ABNORMAL HIGH (ref 70–99)
Glucose, Bld: 118 mg/dL — ABNORMAL HIGH (ref 70–99)
Sodium: 122 mEq/L — ABNORMAL LOW (ref 135–145)

## 2012-12-20 LAB — CBC
Hemoglobin: 11.1 g/dL — ABNORMAL LOW (ref 12.0–15.0)
MCH: 32.8 pg (ref 26.0–34.0)
MCV: 89.9 fL (ref 78.0–100.0)
Platelets: 202 10*3/uL (ref 150–400)
RBC: 3.38 MIL/uL — ABNORMAL LOW (ref 3.87–5.11)
WBC: 13 10*3/uL — ABNORMAL HIGH (ref 4.0–10.5)

## 2012-12-20 LAB — CBC WITH DIFFERENTIAL/PLATELET
Basophils Relative: 0 % (ref 0–1)
Blasts: 0 %
Hemoglobin: 10.6 g/dL — ABNORMAL LOW (ref 12.0–15.0)
Lymphocytes Relative: 3 % — ABNORMAL LOW (ref 12–46)
Lymphs Abs: 0.5 10*3/uL — ABNORMAL LOW (ref 0.7–4.0)
MCHC: 34.1 g/dL (ref 30.0–36.0)
Neutro Abs: 15.7 10*3/uL — ABNORMAL HIGH (ref 1.7–7.7)
Neutrophils Relative %: 93 % — ABNORMAL HIGH (ref 43–77)
Platelets: 84 10*3/uL — ABNORMAL LOW (ref 150–400)
Promyelocytes Absolute: 0 %
RDW: 13.3 % (ref 11.5–15.5)
nRBC: 0 /100 WBC

## 2012-12-20 LAB — RAPID URINE DRUG SCREEN, HOSP PERFORMED
Amphetamines: NOT DETECTED
Benzodiazepines: NOT DETECTED
Cocaine: NOT DETECTED

## 2012-12-20 LAB — GLUCOSE, CAPILLARY

## 2012-12-20 LAB — CK TOTAL AND CKMB (NOT AT ARMC)
CK, MB: 8.4 ng/mL (ref 0.3–4.0)
Relative Index: 2.3 (ref 0.0–2.5)
Total CK: 363 U/L — ABNORMAL HIGH (ref 7–177)

## 2012-12-20 LAB — OSMOLALITY, URINE: Osmolality, Ur: 135 mosm/kg — ABNORMAL LOW (ref 390–1090)

## 2012-12-20 LAB — HEPATIC FUNCTION PANEL
AST: 29 U/L (ref 0–37)
Albumin: 3.7 g/dL (ref 3.5–5.2)
Alkaline Phosphatase: 64 U/L (ref 39–117)
Total Bilirubin: 0.4 mg/dL (ref 0.3–1.2)
Total Protein: 7.1 g/dL (ref 6.0–8.3)

## 2012-12-20 LAB — URINALYSIS, ROUTINE W REFLEX MICROSCOPIC
Bilirubin Urine: NEGATIVE
Glucose, UA: NEGATIVE mg/dL
Ketones, ur: NEGATIVE mg/dL
Leukocytes, UA: NEGATIVE
Specific Gravity, Urine: 1.007 (ref 1.005–1.030)
pH: 6.5 (ref 5.0–8.0)

## 2012-12-20 LAB — TSH: TSH: 2.479 u[IU]/mL (ref 0.350–4.500)

## 2012-12-20 LAB — OSMOLALITY: Osmolality: 258 mosm/kg — ABNORMAL LOW (ref 275–300)

## 2012-12-20 LAB — ETHANOL: Alcohol, Ethyl (B): 12 mg/dL — ABNORMAL HIGH (ref 0–11)

## 2012-12-20 MED ORDER — SODIUM CHLORIDE 0.9 % IJ SOLN: 3.0000 mL | INTRAMUSCULAR | Status: DC | PRN

## 2012-12-20 MED ORDER — VENLAFAXINE HCL 37.5 MG PO TABS
37.5000 mg | ORAL_TABLET | Freq: Two times a day (BID) | ORAL | Status: DC
  Administered 2012-12-20 – 2012-12-21 (×2): 37.5 mg via ORAL
  Filled 2012-12-20 (×3): qty 1

## 2012-12-20 MED ORDER — LORAZEPAM 1 MG PO TABS: 1.0000 mg | ORAL_TABLET | Freq: Four times a day (QID) | ORAL | Status: DC | PRN

## 2012-12-20 MED ORDER — ASPIRIN 81 MG PO CHEW
81.0000 mg | CHEWABLE_TABLET | Freq: Every day | ORAL | Status: DC
  Administered 2012-12-20 – 2012-12-21 (×2): 81 mg via ORAL
  Filled 2012-12-20 (×2): qty 1

## 2012-12-20 MED ORDER — LISINOPRIL 10 MG PO TABS
10.0000 mg | ORAL_TABLET | Freq: Every day | ORAL | Status: DC
  Administered 2012-12-21: 10 mg via ORAL
  Filled 2012-12-20 (×2): qty 1

## 2012-12-20 MED ORDER — LORAZEPAM 2 MG/ML IJ SOLN: 1.0000 mg | Freq: Four times a day (QID) | INTRAMUSCULAR | Status: DC | PRN

## 2012-12-20 MED ORDER — INSULIN ASPART 100 UNIT/ML ~~LOC~~ SOLN
0.0000 [IU] | Freq: Three times a day (TID) | SUBCUTANEOUS | Status: DC
  Administered 2012-12-21: 1 [IU] via SUBCUTANEOUS

## 2012-12-20 MED ORDER — THIAMINE HCL 100 MG/ML IJ SOLN
100.0000 mg | Freq: Every day | INTRAMUSCULAR | Status: DC
  Filled 2012-12-20 (×2): qty 1

## 2012-12-20 MED ORDER — ACETAMINOPHEN 650 MG RE SUPP: 650.0000 mg | Freq: Four times a day (QID) | RECTAL | Status: DC | PRN

## 2012-12-20 MED ORDER — ADULT MULTIVITAMIN W/MINERALS CH
1.0000 | ORAL_TABLET | Freq: Every day | ORAL | Status: DC
  Administered 2012-12-20 – 2012-12-21 (×2): 1 via ORAL
  Filled 2012-12-20 (×2): qty 1

## 2012-12-20 MED ORDER — FOLIC ACID 1 MG PO TABS
1.0000 mg | ORAL_TABLET | Freq: Every day | ORAL | Status: DC
  Administered 2012-12-20 – 2012-12-21 (×2): 1 mg via ORAL
  Filled 2012-12-20 (×2): qty 1

## 2012-12-20 MED ORDER — SODIUM CHLORIDE 0.9 % IV SOLN: 250.0000 mL | INTRAVENOUS | Status: DC | PRN

## 2012-12-20 MED ORDER — SODIUM CHLORIDE 0.9 % IV BOLUS (SEPSIS)
1000.0000 mL | Freq: Once | INTRAVENOUS | Status: AC
  Administered 2012-12-20: 1000 mL via INTRAVENOUS

## 2012-12-20 MED ORDER — SODIUM CHLORIDE 0.9 % IV SOLN
Freq: Once | INTRAVENOUS | Status: AC
  Administered 2012-12-20: 11:00:00 via INTRAVENOUS

## 2012-12-20 MED ORDER — GABAPENTIN 300 MG PO CAPS
300.0000 mg | ORAL_CAPSULE | Freq: Two times a day (BID) | ORAL | Status: DC
  Administered 2012-12-20 – 2012-12-21 (×2): 300 mg via ORAL
  Filled 2012-12-20 (×3): qty 1

## 2012-12-20 MED ORDER — VITAMIN B-1 100 MG PO TABS
100.0000 mg | ORAL_TABLET | Freq: Every day | ORAL | Status: DC
  Administered 2012-12-20 – 2012-12-21 (×2): 100 mg via ORAL
  Filled 2012-12-20 (×2): qty 1

## 2012-12-20 MED ORDER — ATORVASTATIN CALCIUM 40 MG PO TABS
40.0000 mg | ORAL_TABLET | Freq: Every day | ORAL | Status: DC
  Filled 2012-12-20: qty 1

## 2012-12-20 MED ORDER — SODIUM CHLORIDE 0.9 % IJ SOLN
3.0000 mL | Freq: Two times a day (BID) | INTRAMUSCULAR | Status: DC
  Administered 2012-12-20 – 2012-12-21 (×2): 3 mL via INTRAVENOUS

## 2012-12-20 MED ORDER — ACETAMINOPHEN 325 MG PO TABS: 650.0000 mg | ORAL_TABLET | Freq: Four times a day (QID) | ORAL | Status: DC | PRN

## 2012-12-20 NOTE — ED Notes (Signed)
Pt. Went to bed last night was normal.  Pt. s baseline is Alert and oriented X4.  Independent at home.  Husband reports pt. Came out of bedroom very confused and husband placed pt. In the bathroom  She had diarrhea all over her.  Paramedics found her unresponsive on the toilet, vitals are stable. Airway intact.  Pt. Woke up en-route very confused. Pulled out her iv,  diarrhea all over her self.

## 2012-12-20 NOTE — ED Provider Notes (Signed)
History     CSN: [id number]  Arrival date & time 12/20/12  8413   First MD Initiated Contact with Patient 12/20/12 0750      Chief Complaint  Patient presents with  . Altered Mental Status    (Consider location/radiation/quality/duration/timing/severity/associated sxs/prior treatment) HPI Comments: Pt comes in with cc of AMS. LEVEL 5 CAVEAT FOR ALTERED MENTAL STATUS. Pt has hx of type 2 DM. Last seen normal y'day at 11 pm. Per husband, patient is normally aox4, very functional. This morning when husband woke up, patient was noted to be covered with fecal material and was very weak and sluggish. He took her to the bathroom, where she became unresponsive, and so EMS was called. EMS reports that patient regained consciousness en route. For [place] patient is confused, aox1. She has no focal complains.    Patient is a 72 y.o. female presenting with altered mental status. The history is provided by the patient, the EMS personnel and the spouse.  Altered Mental Status    Past Medical History  Diagnosis Date  . Postlaminectomy syndrome, lumbar region   . Disturbance of skin sensation   . Disorders of sacrum   . Hyperlipidemia   . Lumbosacral spondylosis without myelopathy   . Asthma     Situational (environmental triggers)  . Cancer   . Diabetes mellitus   . Depression   . Hypertension   . Ulcer     Past Surgical History  Procedure Laterality Date  . Spine surgery      2 back procedures  . Antrectomy      Vagotomy done also for severe peptic ulcer disease  . Vagotomy and pyloroplasty    . Eye surgery      Cataracts    Family History  Problem Relation Age of Onset  . Asthma Sister   . Allergies Sister   . Diabetes Brother   . Hypertension Brother     History  Substance Use Topics  . Smoking status: Former Smoker -- 2.00 packs/day for 30 years    Types: Cigarettes    Quit date: 10/19/1990  . Smokeless tobacco: Not on file  . Alcohol Use: Not on file    OB  History   Grav Para Term Preterm Abortions TAB SAB Ect Mult Living                  Review of Systems  Unable to perform ROS: Mental status change  Psychiatric/Behavioral: Positive for altered mental status.    Allergies  Gentamicin; Polymyxin b; Mupirocin; and Pneumococcal vaccines  Home Medications   Current Outpatient Rx  Name  Route  Sig  Dispense  Refill  . calcium elemental as carbonate (BARIATRIC TUMS ULTRA) 400 MG tablet   Oral   Chew 1,000 mg by mouth daily.         . cholecalciferol (VITAMIN D) 1000 UNITS tablet   Oral   Take 1,000 Units by mouth daily.         Marland Kitchen DOCUSATE CALCIUM PO   Oral   Take 1 tablet by mouth daily.          Marland Kitchen gabapentin (NEURONTIN) 300 MG capsule   Oral   Take 300 mg by mouth 2 (two) times daily.         Marland Kitchen HORSE CHESTNUT PO   Oral   Take 1 tablet by mouth daily.         Marland Kitchen HYDROcodone-acetaminophen (NORCO/VICODIN) 5-325 MG per tablet   Oral  Take 1 tablet by mouth every 6 (six) hours as needed for pain.         Marland Kitchen lisinopril (PRINIVIL,ZESTRIL) 10 MG tablet   Oral   Take 10 mg by mouth daily.         . metFORMIN (GLUCOPHAGE) 500 MG tablet   Oral   Take 250 mg by mouth 2 (two) times daily with a meal.         . Multiple Vitamin (MULTIVITAMIN WITH MINERALS) TABS   Oral   Take 1 tablet by mouth daily.         . nebivolol (BYSTOLIC) 10 MG tablet   Oral   Take 1 tablet (10 mg total) by mouth daily.   30 tablet   5   . Omega-3 Fatty Acids (FISH OIL PO)   Oral   Take 1 tablet by mouth daily.          . promethazine (PHENERGAN) 25 MG tablet   Oral   Take 25 mg by mouth every 6 (six) hours as needed for nausea.          . simvastatin (ZOCOR) 80 MG tablet   Oral   Take 40 mg by mouth daily.          Marland Kitchen venlafaxine (EFFEXOR) 37.5 MG tablet   Oral   Take 37.5 mg by mouth 2 (two) times daily.         Marland Kitchen VITAMIN A PO   Oral   Take 8,000 Units by mouth daily.          . vitamin B-12  (CYANOCOBALAMIN) 1000 MCG tablet   Oral   Take 2,000 mcg by mouth daily.         . Vitamins-Lipotropics (COMPLEX B-100 PO)   Oral   Take 1 tablet by mouth daily.           BP 150/97  Pulse 53  Temp(Src) 96.8 F (36 C) (Rectal)  Resp 14  SpO2 100%  Physical Exam  Nursing note and vitals reviewed. Constitutional: She appears well-developed and well-nourished.  HENT:  Head: Normocephalic and atraumatic.  Eyes: EOM are normal. Pupils are equal, round, and reactive to light.  Neck: Neck supple.  Cardiovascular: Normal rate, regular rhythm and normal heart sounds.   No murmur heard. Pulmonary/Chest: Effort normal. No respiratory distress.  Abdominal: Soft. She exhibits no distension. There is no tenderness. There is no rebound and no guarding.  Neurological: She is alert. No cranial nerve deficit. Coordination normal.  Skin: Skin is warm and dry.    ED Course  Procedures (including critical care time)  Labs Reviewed  GLUCOSE, CAPILLARY - Abnormal; Notable for the following:    Glucose-Capillary 144 (*)    All other components within normal limits  CBC WITH DIFFERENTIAL  BASIC METABOLIC PANEL  TROPONIN I  URINALYSIS, ROUTINE W REFLEX MICROSCOPIC  HEPATIC FUNCTION PANEL   No results found.   No diagnosis found.    MDM  DDx includes:  Stroke - ischemic vs. hemorrhagic TIA Electrolyte abnormality Seizures Medication induces/tox syndrome  Pt with type 2 DM hx comes in with AMS. Initial impression is stroke vs. Seizures. Low suspicion for tox/metabolic etiologies at this time. No focal neuro findings.  Will call Neurology for formal consult.    Derwood Kaplan, MD 12/20/12 [phone]

## 2012-12-20 NOTE — ED Notes (Signed)
Pt. Is having an EEG at the bedside

## 2012-12-20 NOTE — Consult Note (Signed)
NEURO HOSPITALIST CONSULT NOTE    Reason for Consult: altered mental status.  HPI:                                                                                                                                          [name] is an 72 y.o. female with a past medical history significant for hypertension, diabetes mellitus,  hyperlipidemia, diabetic neuropathy, status lumbar laminectomy, chronic low back pain, depression, ETOH abuse, brought to the hospital by EMS after being found unresponsive at home by husband. She drinks alcohol many times heavily but they denies excessive alcohol intake in the last couple of days. Patient's husband stated that last night she went to bed at 11 pm, feeling fine, but when she woke up at 6:30 am today he found her going to the bathroom and she looked extremely confused, and then he realized that she was unresponsive, completely limp, and he had to hold her in his arms for few minutes until he was able to seat her in the toilet. She was incontinent of bladder and stools. She was not having any abnormal movements," just completely limp and unresponsive for more than 30 minutes". Her husband said that when medics got there about 15 minutes later she was still unresponsive, and when regained consciousness was very confused, disoriented, and amnestic for the episode. She was taken to [facility] where she was described as mildly confused but without focal findings on neuro-exam. Urgent CT brain showed no acute intracranial abnormalities. At this moment, she is alert, awake, and answering questions appropriately. No reported spells suspicious for seizures in the past. Takes hydrocodone 3 times daily but no recent changes in medication. No history of severe head injury, stroke, CNS infections, or febrile seizures. No family history of epilepsy.   Past Medical History  Diagnosis Date  . Postlaminectomy syndrome, lumbar region   . Disturbance of  skin sensation   . Disorders of sacrum   . Hyperlipidemia   . Lumbosacral spondylosis without myelopathy   . Asthma     Situational (environmental triggers)  . Cancer   . Diabetes mellitus   . Depression   . Hypertension   . Ulcer     Past Surgical History  Procedure Laterality Date  . Spine surgery      2 back procedures  . Antrectomy      Vagotomy done also for severe peptic ulcer disease  . Vagotomy and pyloroplasty    . Eye surgery      Cataracts    Family History  Problem Relation Age of Onset  . Asthma Sister   . Allergies Sister   . Diabetes Brother   . Hypertension Brother     Family History: no epilepsy.   Social History:  reports that she quit smoking about 22 years ago. Her smoking use included Cigarettes. She has a 60 pack-year smoking history. She does not have any smokeless tobacco history on file. Her alcohol and drug histories are not on file.  Allergies  Allergen Reactions  . Gentamicin   . Polymyxin B   . Mupirocin Rash  . Pneumococcal Vaccines Rash    MEDICATIONS:                                                                                                                     I have reviewed the patient's current medications.   ROS:                                                                                                                                       History obtained from the patient, husband, and chart review.  General ROS: negative for - chills, fatigue, fever, night sweats, weight gain or weight loss Psychological ROS: negative for - behavioral disorder, hallucinations, memory difficulties, or suicidal ideation Ophthalmic ROS: negative for - blurry vision, double vision, eye pain or loss of vision ENT ROS: negative for - epistaxis, nasal discharge, oral lesions, sore throat, tinnitus or vertigo Allergy and Immunology ROS: negative for - hives or itchy/watery eyes Hematological and Lymphatic ROS: negative for - bleeding  problems, bruising or swollen lymph nodes Endocrine ROS: negative for - galactorrhea, hair pattern changes, polydipsia/polyuria or temperature intolerance Respiratory ROS: negative for - cough, hemoptysis, shortness of breath or wheezing Cardiovascular ROS: negative for - chest pain, dyspnea on exertion, edema or irregular heartbeat Gastrointestinal ROS: negative for - abdominal pain, diarrhea, hematemesis, nausea/vomiting or stool incontinence Genito-Urinary ROS: negative for - dysuria, hematuria, incontinence or urinary frequency/urgency Musculoskeletal ROS: negative for - joint swelling or muscular weakness Neurological ROS: as noted in HPI Dermatological ROS: negative for rash and skin lesion changes   Physical exam: pleasant female in no apparent distress. Blood pressure 186/100, pulse 53, temperature 96.8 F (36 C), temperature source Rectal, resp. rate 13, SpO2 100.00%. Head: normocephalic. Neck: supple, no bruits, no JVD. Cardiac: no murmurs. Lungs: clear. Abdomen: soft, no tender, no mass. Extremities: no edema.  Neurologic Examination:  Mental Status: Alert,awake, oriented x 4, thought content appropriate. Comprehension, naming, and repetition within normal limits. Speech fluent without evidence of aphasia.  Able to follow 3 step commands without difficulty. Cranial Nerves: II: Discs flat bilaterally; Visual fields grossly normal, pupils equal, round, reactive to light and accommodation III,IV, VI: ptosis not present, extra-ocular motions intact bilaterally V,VII: smile symmetric, facial light touch sensation normal bilaterally VIII: hearing normal bilaterally IX,X: gag reflex present XI: bilateral shoulder shrug XII: midline tongue extension Motor: Right : Upper extremity   5/5    Left:     Upper extremity   5/5  Lower extremity   5/5     Lower extremity   5/5 Tone and  bulk:normal tone throughout; no atrophy noted Sensory: Pinprick and light touch intact throughout, bilaterally Deep Tendon Reflexes: 2+ and symmetric throughout Plantars: Right: downgoing   Left: downgoing Cerebellar: normal finger-to-nose,  normal heel-to-shin test Gait: no ataxia. CV: pulses palpable throughout    Lab Results  Component Value Date/Time   CHOL 173 02/16/2012  3:55 PM    Results for orders placed during the hospital encounter of 12/20/12 (from the past 48 hour(s))  GLUCOSE, CAPILLARY     Status: Abnormal   Collection Time    12/20/12  8:13 AM      Result Value Range   Glucose-Capillary 144 (*) 70 - 99 mg/dL    Ct Head Wo Contrast  12/20/2012  *RADIOLOGY REPORT*  Clinical Data: Altered mental status  CT HEAD WITHOUT CONTRAST  Technique:  Contiguous axial images were obtained from the base of the skull through the vertex without contrast.  Comparison: 09/05/2012  Findings: No skull fracture is noted.  Paranasal sinuses and mastoid air cells are unremarkable.  No intracranial hemorrhage, mass effect or midline shift.  Stable cerebral atrophy.  Stable periventricular and patchy subcortical white matter decreased attenuation consistent with chronic small vessel ischemic changes. No acute cortical infarction.  No mass lesion is noted on this unenhanced scan.  IMPRESSION: No acute intracranial abnormality.  Stable atrophy and chronic white matter disease.   Original Report Authenticated By: Natasha Mead, M.D.      Assessment/Plan: 72 years old female brought to the hospital after sustaining a rather prolonged episode of acute alteration of consciousness with bladder and bowel incontinence. Non focal neuro-exam and she is now back to baseline. CT brain unremarkable. I am concerned about a possible seizure. Syncope and stroke seems to be less likely. Will suggest admission to the medicine service, EEG, and MRI brain with and without contrast. Will follow up.  Wyatt Portela,  MD [facility] [phone]  12/20/2012, 10:10 AM

## 2012-12-20 NOTE — ED Notes (Signed)
Patient is resting comfortably. 

## 2012-12-20 NOTE — ED Notes (Signed)
Lab reported to Dr. Lennox Grumbles

## 2012-12-20 NOTE — ED Notes (Signed)
Admitting Md at the bedside 

## 2012-12-20 NOTE — ED Notes (Signed)
admiott

## 2012-12-20 NOTE — Progress Notes (Signed)
EEG completed.

## 2012-12-20 NOTE — Discharge Summary (Signed)
Patient Name:  [name] MRN: [id number]  PCP: Sheri March, MD DOB:  1941-10-05       Date of Admission:  12/20/2012  Date of Discharge:  12/21/2012     Attending Physician: Dr. Dalphine Handing         DISCHARGE DIAGNOSES: Obtundation/AMS Hyponatremia Alcoholism HTN Non-obstructive CAD Depression Diabetic neuropathy    DISPOSITION AND FOLLOW-UP: [name] is to follow-up with the listed providers as detailed below, at which time, the following should be addressed:   1. F/u on patient's alcohol use. Pursue detoxification and rehabilitation when patient is willing.  2. Labs / imaging needed: BMET 3. Pending labs/ test needing follow-up: N/A  Follow-up Information   Follow up with PENUMALLI,VIKRAM, MD. Schedule an appointment as soon as possible for a visit in 2 months.   Contact information:   [address], [id number] [facility] [place] [address] [phone]        Discharge Orders   Future Appointments [name] Department Dept Phone   01/10/2013 4:00 PM Sheri March, MD [facility] [phone]   Future Orders Complete By Expires     Diet - low sodium heart healthy  As directed     Diet - low sodium heart healthy  As directed     Diet - low sodium heart healthy  As directed         DISCHARGE MEDICATIONS:   Medication List    STOP taking these medications       nebivolol 10 MG tablet  Commonly known as:  BYSTOLIC      TAKE these medications       calcium elemental as carbonate 400 MG tablet  Commonly known as:  BARIATRIC TUMS ULTRA  Chew 1,000 mg by mouth daily.     cholecalciferol 1000 UNITS tablet  Commonly known as:  VITAMIN D  Take 1,000 Units by mouth daily.     COMPLEX B-100 PO  Take 1 tablet by mouth daily.     DOCUSATE CALCIUM PO  Take 1 tablet by mouth daily.     FISH OIL PO  Take 1 tablet by mouth daily.     folic acid 1 MG tablet  Commonly known as:  FOLVITE  Take 1  tablet (1 mg total) by mouth daily.     gabapentin 300 MG capsule  Commonly known as:  NEURONTIN  Take 300 mg by mouth 2 (two) times daily.     HORSE CHESTNUT PO  Take 1 tablet by mouth daily.     HYDROcodone-acetaminophen 5-325 MG per tablet  Commonly known as:  NORCO/VICODIN  Take 1 tablet by mouth every 6 (six) hours as needed for pain.     lisinopril 10 MG tablet  Commonly known as:  PRINIVIL,ZESTRIL  Take 10 mg by mouth daily.     magnesium oxide 400 (241.3 MG) MG tablet  Commonly known as:  MAG-OX  Take 1 tablet (400 mg total) by mouth 2 (two) times daily.     metFORMIN 500 MG tablet  Commonly known as:  GLUCOPHAGE  Take 250 mg by mouth 2 (two) times daily with a meal.     multivitamin with minerals Tabs  Take 1 tablet by mouth daily.     promethazine 25 MG tablet  Commonly known as:  PHENERGAN  Take 25 mg by mouth every 6 (six) hours as needed for nausea.     simvastatin 80 MG  tablet  Commonly known as:  ZOCOR  Take 40 mg by mouth daily.     thiamine 100 MG tablet  Take 1 tablet (100 mg total) by mouth daily.     venlafaxine 37.5 MG tablet  Commonly known as:  EFFEXOR  Take 37.5 mg by mouth 2 (two) times daily.     VITAMIN A PO  Take 8,000 Units by mouth daily.     vitamin B-12 1000 MCG tablet  Commonly known as:  CYANOCOBALAMIN  Take 2,000 mcg by mouth daily.         CONSULTS:  Neurology   PROCEDURES PERFORMED:  Ct Head Wo Contrast 12/20/2012    IMPRESSION: No acute intracranial abnormality.  Stable atrophy and chronic white matter disease.   Original Report Authenticated By: Natasha Mead, M.D.    [name] Wo Contrast  12/20/2012 IMPRESSION: Atrophy and chronic microvascular ischemic change.  No acute intracranial findings.  Suspected degenerative change of the occiput-C1 and C1-2 interspaces.  Consider further evaluation with CT or MRI of the cervical spine.   Original Report Authenticated By: Davonna Belling, M.D.       ADMISSION DATA: H&P:    Patient is a 72 y.o. female with a PMHx of T2DM, HTN, diabetic neuropathy, chronic low back pain, depression, alcoholism, who presents to [facility] for evaluation of altered mental status and weakness. The patient's husband is present during exam and provides much of the history. Per the patient's husband, when they awoke this morning around 0630 the husband found the patient to be very weak and minimally verbally responsive. He states that when he tried to get her to move she became even more weak, and ultimately became totally unresponsive after he had taken her to the bathroom to clean herself off (the patient had soiled herself). He then called EMS, who brought the patient to the emergency department. The husband states the patient was unconscious for approximately 1 hour prior to presentation.  The patient's husband limits that the patient has been having a very poor short-term memory for at least the last several months. He states that the patient had been seen at [facility] neurology for this issue recently. He admits that she drinks a significant quantity of alcohol at home, consisting mostly of beer, of which she drinks 6-7 beers a day on average. He states that she had only 2 beers on the day prior to the event. He has not noticed any other unusual behavior with the patient recently, he denies any knowledge of her having any similar events in the past. He does admit her having some "falls" since last summer, however he believes that these were secondary to her tripping on objects. The husband as the patient has had hyponatremia since last year, and believes that she may have had episodes of hyponatremia prior to that as well, however he again denies that the patient has previously had any similar events such as the current one in relation to this issue.  The patient herself is able to interact during exam, however can provide only limited history. This is apparently her baseline, per the husband's report.    Physical Exam: General:  Vital signs reviewed and noted. Well-developed, well-nourished, in no acute distress; alert and cooperative throughout examination.  Head:  Normocephalic, atraumatic.  Eyes:  PERRL, EOMI, No signs of anemia or jaundince.  Nose:  Mucous membranes moist, not inflammed, nonerythematous.  Throat:  Oropharynx nonerythematous, no exudate appreciated.  Neck:  No deformities, masses, or tenderness  noted.  Lungs:  Normal respiratory effort. Clear to auscultation BL without crackles or wheezes.  Heart:  RRR. S1 and S2 normal without gallop, murmur, or rubs.  Abdomen:  BS normoactive. Soft, Nondistended, non-tender. No masses or organomegaly.  Extremities:  No pretibial edema.  Neurologic:  A&Ox3. CN II - XII are grossly intact. Motor strength is 5/5 in the all 4 extremities, Sensations intact to light touch, Cerebellar signs negative.  Skin:  No visible rashes, scars.    Labs: Comprehensive Metabolic Panel:    Component  Value  Date/Time    NA  123*  12/20/2012 0950    K  3.7  12/20/2012 0950    CL  90*  12/20/2012 0950    CO2  20  12/20/2012 0950    BUN  30*  12/20/2012 0950    CREATININE  1.10  12/20/2012 0950    CREATININE  0.84  10/04/2012 1736    GLUCOSE  133*  12/20/2012 0950    CALCIUM  8.2*  12/20/2012 0950    AST  29  12/20/2012 1040    ALT  8  12/20/2012 1040    ALKPHOS  64  12/20/2012 1040    BILITOT  0.4  12/20/2012 1040    PROT  7.1  12/20/2012 1040    ALBUMIN  3.7  12/20/2012 1040    CBC:    Component  Value  Date/Time    WBC  16.9*  12/20/2012 0950    HGB  10.6*  12/20/2012 0950    HCT  31.1*  12/20/2012 0950    PLT  84*  12/20/2012 0950    MCV  69.9*  12/20/2012 0950    NEUTROABS  15.7*  12/20/2012 0950    LYMPHSABS  0.5*  12/20/2012 0950    MONOABS  0.7  12/20/2012 0950    EOSABS  0.0  12/20/2012 0950    BASOSABS  0.0  12/20/2012 0950    Urinalysis:   Recent Labs   12/20/12 0949   COLORURINE  YELLOW   LABSPEC  1.007   PHURINE  6.5   GLUCOSEU  NEGATIVE   HGBUR   NEGATIVE   BILIRUBINUR  NEGATIVE   KETONESUR  NEGATIVE   PROTEINUR  NEGATIVE   UROBILINOGEN  0.2   NITRITE  NEGATIVE   LEUKOCYTESUR  NEGATIVE    Historical Labs:  Lab Results   Component  Value  Date    HGBA1C  5.2  07/21/2012    Lab Results   Component  Value  Date    CHOL  173  02/16/2012    HDL  60  02/16/2012    LDLCALC  74  02/16/2012    TRIG  196*  02/16/2012    CHOLHDL  2.9  02/16/2012    Lab Results   Component  Value  Date    TSH  2.534  02/16/2012      HOSPITAL COURSE: Obtundation/altered mental status - likely secondary to hyponatremia and also alcohol intoxication (+ for EtOH on admission). CT head negative for acute pathology. The patient was seen by neurology during her course, and an MRI and EEG were obtained, both of which were unrevealing for a cause for her presentation, with no evidence to suggest stroke or seizure. The patient returned to her baseline mental status shortly after presenting to the hospital, and remained at baseline through the duration of her course.   Hyponatremia - given the patient's significant history of alcoholism, this issue is most  likely secondary to beer potomania with reset osmostat, which is supported by her urine osms = 135. As her hyponatremia is chronic for at least ~1 year per Epic records, it was not rapidly corrected. She was counseled on the importance of eating a diet consisting of solid food with protein and sodium content, rather than subsisting largely on beer alone as she appears to have been doing. Na = 124 at discharge.   Alcoholism - patient drinks significant quantities of alcohol, with a conservative estimate of her consumption by her husband of 6-7 beers per day on average. EtOH + on admission. It is highly likely that this issue is the cause of her hyponatremia and also her altered mental status. She received thiamine, folate, and a multivitamin during her course, and was discharged with instructions to take all three going  forward. 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 In order to correct her hyponatremia and related issues with obtundation and altered mental status, she needs to cease her alcohol use, which given her significant history of alcohol abuse will likely require inpatient detoxification. This was discussed with the patient prior to discharge, and she says she'll be strongly considering this option and will discuss with her PCP at hospital follow-up. Pt had no signs/symptoms of withdrawal during her brief hospital course.   HTN - stable on home lisinopril. Nebivolol d/c'd at discharge 2/2 mild bradycardia.   Non-obstructive CAD - stable on home meds.   Depression - stable on venlafaxine.   Diabetic neuropathy - continue home gabapentin.     DISCHARGE DATA: Vital Signs: BP 133/42  Pulse 53  Temp(Src) 98.4 F (36.9 C) (Oral)  Resp 18  Ht 5\' 4"  (1.626 m)  Wt 155 lb (70.308 kg)  BMI 26.59 kg/m2  SpO2 99%   Time spent on discharge: 35 minutes  Services Ordered on Discharge: Y = Yes; Blank = No PT:   OT:   RN:   Equipment:   Other:     Signed: Elfredia Nevins, MD   PGY 1, Internal Medicine Resident 12/24/2012, 9:07 AM

## 2012-12-20 NOTE — Procedures (Signed)
EEG report.  Brief clinical history:  years old female found unresponsive, incontinent of stools and bladder. No prior history of frank epileptic seizures. History of alcohol abuse. Technique: this is a 17 channel routine scalp EEG performed at the bedside with bipolar and monopolar montages arranged in accordance to the international 10/20 system of electrode placement. One channel was dedicated to EKG recording.  The study was performed during wakefulness and drowsiness. Intermittent photic stimulation was utilized as activating procedure.  Description:In the wakeful state, the best background consisted of a medium amplitude, posterior dominant, poorly sustained, symmetric and reactive 9 Hz rhythm. Drowsiness demonstrated dropout of the alpha rhythm. Intermittent photic stimulation did not induce normal driving response.  No focal or generalized epileptiform discharges noted.  No pathologic areas of slowing seen.  EKG showed sinus rhythm.  Impression: this is a normal awake and drowsy EEG. Please, be aware that a normal EEG does not exclude the possibility of epilepsy.  Clinical correlation is advised.  Wyatt Portela, MD

## 2012-12-20 NOTE — H&P (Signed)
Date: 12/20/2012               Patient Name:  [name] MRN: [id number]  DOB: Nov 02, 1940 Age / Sex: 72 y.o., female   PCP: [facility]              Medical Service: Internal Medicine Teaching Service              Attending Physician: Dr. Dalphine Handing    First Contact: Dr. Elenor Legato Pager: [phone]  Second Contact: Dr. Stacy Gardner Pager: [phone]            After Hours (After 5p/  First Contact Pager: [phone]  weekends / holidays): Second Contact Pager: [phone]     Chief Complaint: Altered mental status, weakness  History of Present Illness: Patient is a 72 y.o. female with a PMHx of T2DM, HTN, diabetic neuropathy, chronic low back pain, depression, alcoholism, who presents to [facility] for evaluation of altered mental status and weakness. The patient's husband is present during exam and provides much of the history. Per the patient's husband, when they awoke this morning around 0630 the husband found the patient to be very weak and minimally verbally responsive. He states that when he tried to get her to move she became even more weak, and ultimately became totally unresponsive after he had taken her to the bathroom to clean herself off (the patient had soiled herself). He then called EMS, who brought the patient to the emergency department. The husband states the patient was unconscious for approximately 1 hour prior to presentation.  The patient's husband limits that the patient has been having a very poor short-term memory for at least the last several months. He states that the patient had been seen at [facility] neurology for this issue recently. He admits that she drinks a significant quantity of alcohol at home, consisting mostly of beer, of which she drinks 6-7 beers a day on average. He states that she had only 2 beers on the day prior to the event. He has not noticed any other unusual behavior with the patient recently, he denies any knowledge of her having any similar  events in the past. He does admit her having some "falls" since last summer, however he believes that these were secondary to her tripping on objects. The husband as the patient has had hyponatremia since last year, and believes that she may have had episodes of hyponatremia prior to that as well, however he again denies that the patient has previously had any similar events such as the current one in relation to this issue.  The patient herself is able to interact during exam, however can provide only limited history. This is apparently her baseline, per the husband's report.   Review of Systems: Per HPI.   Current Outpatient Medications: No current facility-administered medications on file prior to encounter.   Current Outpatient Prescriptions on File Prior to Encounter  Medication Sig Dispense Refill  . DOCUSATE CALCIUM PO Take 1 tablet by mouth daily.       Marland Kitchen gabapentin (NEURONTIN) 300 MG capsule Take 300 mg by mouth 2 (two) times daily.      Marland Kitchen lisinopril (PRINIVIL,ZESTRIL) 10 MG tablet Take 10 mg by mouth daily.      . nebivolol (BYSTOLIC) 10 MG tablet Take 1 tablet (10 mg total) by mouth daily.  30 tablet  5  . Omega-3 Fatty Acids (FISH OIL PO) Take 1 tablet by mouth daily.       Marland Kitchen  promethazine (PHENERGAN) 25 MG tablet Take 25 mg by mouth every 6 (six) hours as needed for nausea.       . simvastatin (ZOCOR) 80 MG tablet Take 40 mg by mouth daily.       Marland Kitchen VITAMIN A PO Take 8,000 Units by mouth daily.         Allergies: Allergies  Allergen Reactions  . Gentamicin   . Polymyxin B   . Mupirocin Rash  . Pneumococcal Vaccines Rash     Past Medical History: Past Medical History  Diagnosis Date  . Postlaminectomy syndrome, lumbar region   . Disturbance of skin sensation   . Disorders of sacrum   . Hyperlipidemia   . Lumbosacral spondylosis without myelopathy   . Asthma     Situational (environmental triggers)  . Cancer   . Diabetes mellitus   . Depression   . Hypertension     . Ulcer     Past Surgical History: Past Surgical History  Procedure Laterality Date  . Spine surgery      2 back procedures  . Antrectomy      Vagotomy done also for severe peptic ulcer disease  . Vagotomy and pyloroplasty    . Eye surgery      Cataracts    Family History: Family History  Problem Relation Age of Onset  . Asthma Sister   . Allergies Sister   . Diabetes Brother   . Hypertension Brother     Social History: History   Social History  . Marital Status: Married    Spouse Name: N/A    Number of Children: N/A  . Years of Education: N/A   Occupational History  . Not on file.   Social History Main Topics  . Smoking status: Former Smoker -- 2.00 packs/day for 30 years    Types: Cigarettes    Quit date: 10/19/1990  . Smokeless tobacco: Not on file  . Alcohol Use: Not on file  . Drug Use: Not on file  . Sexually Active: Not on file   Other Topics Concern  . Not on file   Social History Narrative  . No narrative on file     Vital Signs: Blood pressure 145/64, pulse 54, temperature 98.1 F (36.7 C), temperature source Rectal, resp. rate 16, SpO2 100.00%.  Physical Exam: General: Vital signs reviewed and noted. Well-developed, well-nourished, in no acute distress; alert and cooperative throughout examination.  Head: Normocephalic, atraumatic.  Eyes: PERRL, EOMI, No signs of anemia or jaundince.  Nose: Mucous membranes moist, not inflammed, nonerythematous.  Throat: Oropharynx nonerythematous, no exudate appreciated.   Neck: No deformities, masses, or tenderness noted.  Lungs:  Normal respiratory effort. Clear to auscultation BL without crackles or wheezes.  Heart: RRR. S1 and S2 normal without gallop, murmur, or rubs.  Abdomen:  BS normoactive. Soft, Nondistended, non-tender.  No masses or organomegaly.  Extremities: No pretibial edema.  Neurologic: A&Ox3. CN II - XII are grossly intact. Motor strength is 5/5 in the all 4 extremities, Sensations  intact to light touch, Cerebellar signs negative.  Skin: No visible rashes, scars.   Lab results: Comprehensive Metabolic Panel:    Component Value Date/Time   NA 123* 12/20/2012 0950   K 3.7 12/20/2012 0950   CL 90* 12/20/2012 0950   CO2 20 12/20/2012 0950   BUN 30* 12/20/2012 0950   CREATININE 1.10 12/20/2012 0950   CREATININE 0.84 10/04/2012 1736   GLUCOSE 133* 12/20/2012 0950   CALCIUM 8.2* 12/20/2012 0981  AST 29 12/20/2012 1040   ALT 8 12/20/2012 1040   ALKPHOS 64 12/20/2012 1040   BILITOT 0.4 12/20/2012 1040   PROT 7.1 12/20/2012 1040   ALBUMIN 3.7 12/20/2012 1040    CBC:    Component Value Date/Time   WBC 16.9* 12/20/2012 0950   HGB 10.6* 12/20/2012 0950   HCT 31.1* 12/20/2012 0950   PLT 84* 12/20/2012 0950   MCV 69.9* 12/20/2012 0950   NEUTROABS 15.7* 12/20/2012 0950   LYMPHSABS 0.5* 12/20/2012 0950   MONOABS 0.7 12/20/2012 0950   EOSABS 0.0 12/20/2012 0950   BASOSABS 0.0 12/20/2012 0950    Urinalysis:   Recent Labs  12/20/12 0949  COLORURINE YELLOW  LABSPEC 1.007  PHURINE 6.5  GLUCOSEU NEGATIVE  HGBUR NEGATIVE  BILIRUBINUR NEGATIVE  KETONESUR NEGATIVE  PROTEINUR NEGATIVE  UROBILINOGEN 0.2  NITRITE NEGATIVE  LEUKOCYTESUR NEGATIVE    Historical Labs: Lab Results  Component Value Date   HGBA1C 5.2 07/21/2012    Lab Results  Component Value Date   CHOL 173 02/16/2012   HDL 60 02/16/2012   LDLCALC 74 02/16/2012   TRIG 196* 02/16/2012   CHOLHDL 2.9 02/16/2012    Lab Results  Component Value Date   TSH 2.534 02/16/2012     Imaging results:  Ct Head Wo Contrast  12/20/2012  *RADIOLOGY REPORT*  Clinical Data: Altered mental status  CT HEAD WITHOUT CONTRAST  Technique:  Contiguous axial images were obtained from the base of the skull through the vertex without contrast.  Comparison: 09/05/2012  Findings: No skull fracture is noted.  Paranasal sinuses and mastoid air cells are unremarkable.  No intracranial hemorrhage, mass effect or midline shift.  Stable cerebral atrophy.  Stable  periventricular and patchy subcortical white matter decreased attenuation consistent with chronic small vessel ischemic changes. No acute cortical infarction.  No mass lesion is noted on this unenhanced scan.  IMPRESSION: No acute intracranial abnormality.  Stable atrophy and chronic white matter disease.   Original Report Authenticated By: Natasha Mead, M.D.      Other results:  EKG (12/20/2012) - Sinus bradycardia, regular, regular rate of approximately 53 bpm, normal axis, ST segments: normal.     Assessment & Plan:  Pt is a 72 y.o. yo female with a PMHx of T2DM, HTN, diabetic neuropathy, chronic low back pain, depression, alcoholism, who was admitted on 12/20/2012 with symptoms of altered mental status of undetermined etiology.   Altered mental status - etiology unclear. CT head negative for acute pathology. The patient has significant hyponatremia, however it is unclear if the patient's symptoms this morning represented obtundation, as she is chronically hyponatremic and is approximately at her recent baseline with this issue. The patient was seen by neurology in the ED, who are concerned about a possible seizure (stroke in syncope deemed less likely). The patient therefore underwent EEG in the ED, and will undergo MRI brain after admission. The patient has a significant history of alcoholism and currently drinks 6-7 beers per day, thus it is possible that she had a seizure it may be alcohol related. - admit to floor - monitor vitals - MRI brain - f/u EEG results - serum EtOH - seizure precautions - check CK - neurology following  Hyponatremia - given the patient's significant history of alcoholism, this issue is most likely secondary to beer potomania. SIADH is also possibility, although both of these causes would be less given the patient's elevated BUN. As the etiology of this issue is unclear, further investigation is warranted, particularly  as there is significant concern that this issue  may be playing a significant role in the patient's presentation with AMS and possible seizures. - slow correction with normal saline (pt received 1L NS in the ED) - serum/urine Osms - TSH  Alcoholism - as previously described, patient drinks significant quantities of alcohol, with a conservative estimate of her consumption by her husband of 6-7 beers per day on average. It is possible that this issue is the cause of her hyponatremia and also her altered mental status. - CIWA protocol - IV thiamine - folate/multivitamin - serum EtOH   HTN - BP within normal limits on admission (~140/60).  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  - cont lipitor - ASA - cont lisinopril - hold nebivolol 2/2 bradycardia  Depression - on venlafaxine at home. Will continue for now, however this medication can rarely cause hyponatremia, so will d/c if an alternative etiology is not identified.  - cont venlafaxine  Diabetic neuropathy - continue home gabapentin.      DVT PPX - SCDs  CODE STATUS - full  CONSULTS PLACED - neurology  DISPO - Disposition is deferred at this time, awaiting improvement of current medical problems.   Anticipated discharge in approximately 1-2 day(s).   The patient does have a current PCP Dow Adolph, MD) and will not be requiring OPC follow-up after discharge.   The patient does not have transportation limitations that hinder transportation to clinic appointments.   Services Needed at time of discharge: TO BE DETERMINED DURING HOSPITAL COURSE         Y = Yes, Blank = No PT:   OT:   RN:   Equipment:   Other:     Signed: Elfredia Nevins, MD  PGY-1, Internal Medicine Resident Pager: [phone] (7AM-5PM) 12/20/2012, 1:15 PM

## 2012-12-21 DIAGNOSIS — R4182 Altered mental status, unspecified: Secondary | ICD-10-CM

## 2012-12-21 LAB — COMPREHENSIVE METABOLIC PANEL
Albumin: 3.3 g/dL — ABNORMAL LOW (ref 3.5–5.2)
BUN: 8 mg/dL (ref 6–23)
Calcium: 9.1 mg/dL (ref 8.4–10.5)
Creatinine, Ser: 0.96 mg/dL (ref 0.50–1.10)
GFR calc Af Amer: 68 mL/min — ABNORMAL LOW (ref >90)
Glucose, Bld: 99 mg/dL (ref 70–99)
Potassium: 3.9 mEq/L (ref 3.5–5.1)
Total Protein: 6.2 g/dL (ref 6.0–8.3)

## 2012-12-21 LAB — GLUCOSE, CAPILLARY
Glucose-Capillary: 108 mg/dL — ABNORMAL HIGH (ref 70–99)
Glucose-Capillary: 130 mg/dL — ABNORMAL HIGH (ref 70–99)

## 2012-12-21 LAB — CBC
HCT: 29.3 % — ABNORMAL LOW (ref 36.0–46.0)
Hemoglobin: 10.8 g/dL — ABNORMAL LOW (ref 12.0–15.0)
MCV: 88.8 fL (ref 78.0–100.0)
RBC: 3.3 MIL/uL — ABNORMAL LOW (ref 3.87–5.11)
WBC: 10 10*3/uL (ref 4.0–10.5)

## 2012-12-21 LAB — CK TOTAL AND CKMB (NOT AT ARMC)
Relative Index: 1.9 (ref 0.0–2.5)
Total CK: 230 U/L — ABNORMAL HIGH (ref 7–177)

## 2012-12-21 MED ORDER — FOLIC ACID 1 MG PO TABS: 1.0000 mg | ORAL_TABLET | Freq: Every day | ORAL | Status: DC

## 2012-12-21 MED ORDER — MAGNESIUM OXIDE 400 (241.3 MG) MG PO TABS
400.0000 mg | ORAL_TABLET | Freq: Two times a day (BID) | ORAL | Status: DC
  Administered 2012-12-21: 400 mg via ORAL
  Filled 2012-12-21 (×2): qty 1

## 2012-12-21 MED ORDER — THIAMINE HCL 100 MG PO TABS: 100.0000 mg | ORAL_TABLET | Freq: Every day | ORAL | Status: DC

## 2012-12-21 MED ORDER — MAGNESIUM OXIDE 400 (241.3 MG) MG PO TABS: 400.0000 mg | ORAL_TABLET | Freq: Two times a day (BID) | ORAL | Status: DC

## 2012-12-21 NOTE — Progress Notes (Signed)
Subjective:    Patient has no complaints this AM and states she feels back to herself. Her husband agrees she is at her baseline. A discussion regarding the importance of alcohol cessation was undertaken with the patient, and she states she will consider rehabilitation in the near future and will discuss the issue with her PCP after discharge.   Interval Events: No acute events.    Objective:    Vital Signs:   Temp:  [97.9 F (36.6 C)-98.7 F (37.1 C)] 98.4 F (36.9 C) (03/05 1016) Pulse Rate:  [51-60] 53 (03/05 1016) Resp:  [16-18] 18 (03/05 0609) BP: (104-168)/(42-71) 133/42 mmHg (03/05 1016) SpO2:  [98 %-100 %] 99 % (03/05 1016) Weight:  [155 lb (70.308 kg)] 155 lb (70.308 kg) (03/05 0609) Last BM Date: 12/20/12  24-hour weight change: Weight change:   Intake/Output:   Intake/Output Summary (Last 24 hours) at 12/21/12 1716 Last data filed at 12/21/12 1337  Gross per 24 hour  Intake    240 ml  Output      0 ml  Net    240 ml      Physical Exam: General: Vital signs reviewed and noted. Well-developed, well-nourished, in no acute distress; alert, appropriate and cooperative throughout examination.  Lungs:  Normal respiratory effort. Clear to auscultation BL without crackles or wheezes.  Heart: RRR. S1 and S2 normal without gallop, murmur, or rubs.  Abdomen:  BS normoactive. Soft, Nondistended, non-tender.  No masses or organomegaly.  Extremities: No pretibial edema.     Labs:  Basic Metabolic Panel:  Recent Labs Lab 12/20/12 0950 12/20/12 2022 12/21/12 0535  NA 123* 122* 124*  K 3.7 4.2 3.9  CL 90* 88* 90*  CO2 20 19 22   GLUCOSE 133* 118* 99  BUN 30* 7 8  CREATININE 1.10 0.92 0.96  CALCIUM 8.2* 8.8 9.1  MG  --   --  1.3*    Liver Function Tests:  Recent Labs Lab 12/20/12 1040 12/21/12 0535  AST 29 26  ALT 8 13  ALKPHOS 64 56  BILITOT 0.4 0.7  PROT 7.1 6.2  ALBUMIN 3.7 3.3*    CBC:  Recent Labs Lab 12/20/12 0950 12/20/12 2128  12/21/12 0535  WBC 16.9* 13.0* 10.0  NEUTROABS 15.7*  --   --   HGB 10.6* 11.1* 10.8*  HCT 31.1* 30.4* 29.3*  MCV 69.9* 89.9 88.8  PLT 84* 202 203    Cardiac Enzymes:  Recent Labs Lab 12/20/12 1040 12/20/12 1344 12/21/12 1442  CKTOTAL  --  363* 230*  CKMB  --  8.4* 4.3*  TROPONINI <0.30  --   --     CBG:  Recent Labs Lab 12/20/12 0813 12/20/12 2239 12/21/12 0712 12/21/12 1153  GLUCAP 144* 125* 108* 130*    Imaging: Ct Head Wo Contrast  12/20/2012  *RADIOLOGY REPORT*  Clinical Data: Altered mental status  CT HEAD WITHOUT CONTRAST  Technique:  Contiguous axial images were obtained from the base of the skull through the vertex without contrast.  Comparison: 09/05/2012  Findings: No skull fracture is noted.  Paranasal sinuses and mastoid air cells are unremarkable.  No intracranial hemorrhage, mass effect or midline shift.  Stable cerebral atrophy.  Stable periventricular and patchy subcortical white matter decreased attenuation consistent with chronic small vessel ischemic changes. No acute cortical infarction.  No mass lesion is noted on this unenhanced scan.  IMPRESSION: No acute intracranial abnormality.  Stable atrophy and chronic white matter disease.   Original Report Authenticated By:  Natasha Mead, M.D.    Mr [name] Wo Contrast  12/20/2012  *RADIOLOGY REPORT*  Clinical Data: Altered mental status.  Episode of unresponsiveness this morning, now resolved. History of diabetes and hypertension.  MRI HEAD WITHOUT AND WITH CONTRAST  Technique:  Multiplanar, multiecho pulse sequences of the brain and surrounding structures were obtained according to standard protocol without and with intravenous contrast  Contrast:  MultiHance 15 ml  Comparison: CT head earlier today.  Findings: There is no evidence for acute infarction, intracranial hemorrhage, mass lesion, hydrocephalus, or extra-axial fluid. Moderate to severe atrophy is present.  Advanced chronic microvascular ischemic change is  present in the periventricular and subcortical white matter.  Pituitary and cerebellar tonsils unremarkable.  No osseous lesions. Post infusion, no abnormal intracranial enhancement.  Negative orbits, and mastoids. Air-fluid level right maxillary sinus.  Major intracranial vascular structures appear widely patent.  Similar appearance to priors.  Motion degraded sagittal T1-weighted images as well as coronal T1 and T2-weighted images suggest degenerative change at the occiput- C1 and C1-C2 interspaces.  There may be slight pannus surrounding the odontoid.  There is no definite acute fracture, but I cannot exclude mild stenosis or an old injury of the craniocervical junction.  Consider noncontrast CT or MRI of the cervical spine when the patient is stable.  IMPRESSION: Atrophy and chronic microvascular ischemic change.  No acute intracranial findings.  Suspected degenerative change of the occiput-C1 and C1-2 interspaces.  Consider further evaluation with CT or MRI of the cervical spine.   Original Report Authenticated By: Davonna Belling, M.D.        Medications:    Infusions:    Scheduled Medications: . aspirin  81 mg Oral Daily  . atorvastatin  40 mg Oral q1800  . folic acid  1 mg Oral Daily  . gabapentin  300 mg Oral BID  . insulin aspart  0-9 Units Subcutaneous TID WC  . lisinopril  10 mg Oral Daily  . magnesium oxide  400 mg Oral BID  . multivitamin with minerals  1 tablet Oral Daily  . sodium chloride  3 mL Intravenous Q12H  . thiamine  100 mg Oral Daily   Or  . thiamine  100 mg Intravenous Daily  . venlafaxine  37.5 mg Oral BID    PRN Medications: sodium chloride, acetaminophen, acetaminophen, LORazepam, LORazepam, sodium chloride   Assessment/ Plan:   Altered mental status - most likely secondary to hyponatremia which in itself is likely being caused by patient's alcoholism, i.e. beer potomania. MRI neg for acute abnormalities. EEG unrevealing. Neuro has s/o. Pt will f/u with neuro  as an outpatient in 2-3 months, and with her PCP within 1 week. She is appropriate for discharge at this time, as she is at her baseline with her mental status and has no active medical issues.   Hyponatremia - Na = 124 this AM. Urine Osms = 135, consistent with reset osmostat, likely from beer potomania. As pt's hyponatremia is chronic, rapid correction is not indicated. The underlying problem is her alcoholism, which was addressed as below. 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   Alcoholism - counseled pt extensively on importance of alcohol cessation. She was also counseled on avoiding abrupt and unsupported cessation, as she will likely need a detox program and rehabilitation to accomplish this goal. She and her husband will meet with her  PCP and discuss appropriate options regarding this once the patient is psychologically ready to fully commit to alcohol cessation.   DVT PPX - SCDs  CODE STATUS - full  CONSULTS PLACED - neurology  DISPO - Discharge home today.  The patient does have a current PCP Dow Adolph, MD) and will not be requiring OPC follow-up after discharge.  The patient does not have transportation limitations that hinder transportation to clinic appointments. Services Needed at time of discharge: TO BE DETERMINED DURING HOSPITAL COURSE Y = Yes, Blank = No  PT:    OT:    RN:    Equipment:    Other:       Length of Stay: 1 day(s)   Signed: Elfredia Nevins, MD  PGY-1, Internal Medicine Resident Pager: [phone] (7AM-5PM) 12/21/2012, 5:16 PM

## 2012-12-21 NOTE — Progress Notes (Signed)
NEURO HOSPITALIST PROGRESS NOTE   SUBJECTIVE:                                                                                                                        Back to baseline and no further seizures over night.   OBJECTIVE:                                                                                                                           Vital signs in last 24 hours: Temp:  [97.9 F (36.6 C)-98.7 F (37.1 C)] 98.4 F (36.9 C) (03/05 1016) Pulse Rate:  [51-60] 53 (03/05 1016) Resp:  [13-18] 18 (03/05 0609) BP: (104-168)/(39-89) 133/42 mmHg (03/05 1016) SpO2:  [10 %-100 %] 99 % (03/05 1016) Weight:  [70.308 kg (155 lb)] 70.308 kg (155 lb) (03/05 0609)  Intake/Output from previous day: 03/04 0701 - 03/05 0700 In: -  Out: 400 [Urine:400] Intake/Output this shift:   Nutritional status: Carb Control  Past Medical History  Diagnosis Date  . Postlaminectomy syndrome, lumbar region   . Disturbance of skin sensation   . Disorders of sacrum   . Hyperlipidemia   . Lumbosacral spondylosis without myelopathy   . Asthma     Situational (environmental triggers)  . Cancer   . Diabetes mellitus   . Depression   . Hypertension   . Ulcer       Neurologic Exam:   Mental Status: Alert, oriented, thought content appropriate.  Speech fluent without evidence of aphasia.  Able to follow 3 step commands without difficulty. Cranial Nerves: II: Discs flat bilaterally; Visual fields grossly normal, pupils equal, round, reactive to light and accommodation III,IV, VI: ptosis not present, extra-ocular motions intact bilaterally V,VII: smile symmetric, facial light touch sensation normal bilaterally VIII: hearing normal bilaterally IX,X: gag reflex present XI: bilateral shoulder shrug XII: midline tongue extension Motor: Right : Upper extremity   5/5    Left:     Upper extremity   5/5  Lower extremity   5/5     Lower extremity   5/5 Tone  and bulk:normal tone throughout; no atrophy noted Sensory: Pinprick and light touch intact throughout, bilaterally Deep Tendon Reflexes: 2+ and symmetric throughout Plantars: Right: downgoing  Left: downgoing Cerebellar: normal finger-to-nose,  normal heel-to-shin test CV: pulses palpable throughout   Lab Results: Lab Results  Component Value Date/Time   CHOL 173 02/16/2012  3:55 PM   Lipid Panel No results found for this basename: CHOL, TRIG, HDL, CHOLHDL, VLDL, LDLCALC,  in the last 72 hours  Studies/Results: Ct Head Wo Contrast  12/20/2012  *RADIOLOGY REPORT*  Clinical Data: Altered mental status  CT HEAD WITHOUT CONTRAST  Technique:  Contiguous axial images were obtained from the base of the skull through the vertex without contrast.  Comparison: 09/05/2012  Findings: No skull fracture is noted.  Paranasal sinuses and mastoid air cells are unremarkable.  No intracranial hemorrhage, mass effect or midline shift.  Stable cerebral atrophy.  Stable periventricular and patchy subcortical white matter decreased attenuation consistent with chronic small vessel ischemic changes. No acute cortical infarction.  No mass lesion is noted on this unenhanced scan.  IMPRESSION: No acute intracranial abnormality.  Stable atrophy and chronic white matter disease.   Original Report Authenticated By: Natasha Mead, M.D.    Mr [name] Wo Contrast  12/20/2012  *RADIOLOGY REPORT*  Clinical Data: Altered mental status.  Episode of unresponsiveness this morning, now resolved. History of diabetes and hypertension.  MRI HEAD WITHOUT AND WITH CONTRAST  Technique:  Multiplanar, multiecho pulse sequences of the brain and surrounding structures were obtained according to standard protocol without and with intravenous contrast  Contrast:  MultiHance 15 ml  Comparison: CT head earlier today.  Findings: There is no evidence for acute infarction, intracranial hemorrhage, mass lesion, hydrocephalus, or extra-axial fluid. Moderate  to severe atrophy is present.  Advanced chronic microvascular ischemic change is present in the periventricular and subcortical white matter.  Pituitary and cerebellar tonsils unremarkable.  No osseous lesions. Post infusion, no abnormal intracranial enhancement.  Negative orbits, and mastoids. Air-fluid level right maxillary sinus.  Major intracranial vascular structures appear widely patent.  Similar appearance to priors.  Motion degraded sagittal T1-weighted images as well as coronal T1 and T2-weighted images suggest degenerative change at the occiput- C1 and C1-C2 interspaces.  There may be slight pannus surrounding the odontoid.  There is no definite acute fracture, but I cannot exclude mild stenosis or an old injury of the craniocervical junction.  Consider noncontrast CT or MRI of the cervical spine when the patient is stable.  IMPRESSION: Atrophy and chronic microvascular ischemic change.  No acute intracranial findings.  Suspected degenerative change of the occiput-C1 and C1-2 interspaces.  Consider further evaluation with CT or MRI of the cervical spine.   Original Report Authenticated By: Davonna Belling, M.D.    EEG: Impression: this is a normal awake and drowsy EEG. Please, be aware that a normal EEG does not exclude the possibility of epilepsy.   MEDICATIONS                                                                                                                        Scheduled: . aspirin  81  mg Oral Daily  . atorvastatin  40 mg Oral q1800  . folic acid  1 mg Oral Daily  . gabapentin  300 mg Oral BID  . insulin aspart  0-9 Units Subcutaneous TID WC  . lisinopril  10 mg Oral Daily  . multivitamin with minerals  1 tablet Oral Daily  . sodium chloride  3 mL Intravenous Q12H  . thiamine  100 mg Oral Daily   Or  . thiamine  100 mg Intravenous Daily  . venlafaxine  37.5 mg Oral BID    ASSESSMENT/PLAN:                                                                                                             72 years old female brought to the hospital after sustaining a rather prolonged episode of acute alteration of consciousness with bladder and bowel incontinence. CT and MRI unremarkable.  EEG showed no epileptiform activity. Patient has a established neurologist Dr. Danae Orleans of [facility] [phone]).  Plan: No further recommendations at this time.  Would not start AED as patient is back to baseline.  Would have follow up with Dr. Marjory Lies in 2-3 months.  No driving, operating heavy machinery, perform activities at heights, swimming or participation in water activities until release by outpatient physician.  This has been discussed with patient.    Neurology will S/O   Assessment and plan discussed with with attending physician and they are in agreement.    Felicie Morn PA-C [facility] [phone]  12/21/2012, 10:57 AM

## 2012-12-21 NOTE — H&P (Signed)
Internal Medicine Teaching Service Attending Note Date: 12/21/2012  Patient name: [name]  Medical record number: [id number]  Date of birth: 1940/11/11   History: The patient, [name], is a 72 y.o. year old female, with past medical history of diabetes and excessive alcohol use with peripheral neuropathy and memory impairment, B12 deficiency, who comes in with the chief  complaint of altered mental status. I have read the history documented by Dr.McTyre and I concur with the chronology of events.   When I met with the patient today, she was alert, awake, verbal and her husband was by her side who confirmed that she was her normal self. He repeated the entire story to me, which in short would be that the husband saw the patient going towards the bathroom around 6:30 pm on 12/20/12 and she appeared unsteady to him. He rushed towards her and grabbed her, and she basically slumped in his arms, unconscious. He sat her on the toilet and supported her while she suddenly evacuated her bowels and bladder, while still unconscious. The husband sat there for about 20 minutes holding her, before he could manage to get the phone and call 911. The patient started coming back to her senses when paramedics started moving her around. As she reached the hospital, she was conscious and confused. The husband denies any seizure like activity. He does not recall any drooping of her face.    The patient denies any aura before fainting. She does not remember anything. She denies dizziness while getting up from bed. She denies feeling chest pain or palpitations. She admits to pins and needles and decreased sensation in her legs. She reports no leg cramps. She has memory impairment and is seeing a neurologist for that. She   Pat medical history, social history and medications have been reviewed.   Review of systems as per HPI and resident note.   Vitals:  Filed Vitals:   12/21/12 1016  BP: 133/42  Pulse: 53   Temp: 98.4 F (36.9 C)  Resp:     Exam:  I met with patient around 12 pm today  General: Resting in bed, no distress, able to recall year and place.  HEENT: PERRL, EOMI, no scleral icterus. No nystagmus. No tongue bites seen.  Heart: RRR, no rubs, murmurs or gallops. Lungs: Clear to auscultation bilaterally, no wheezes, rales, or rhonchi. Abdomen: Soft, nontender, nondistended, BS present. Extremities: Warm, no pedal edema. Neuro: Alert and oriented X3, cranial nerves II-XII grossly intact,  Sensation impaired in LE bilaterally upto knees due to peripheral neuropathy. Motor strength 5/5 in all 4 extremities. Gait not checked.   Labs:  [facility]   Recent Labs Lab 12/20/12 0950 12/20/12 2022 12/21/12 0535  NA 123* 122* 124*  K 3.7 4.2 3.9  CL 90* 88* 90*  CO2 20 19 22   GLUCOSE 133* 118* 99  BUN 30* 7 8  CREATININE 1.10 0.92 0.96  CALCIUM 8.2* 8.8 9.1  MG  --   --  1.3*    Recent Labs Lab 12/20/12 1040 12/21/12 0535  AST 29 26  ALT 8 13  ALKPHOS 64 56  BILITOT 0.4 0.7  PROT 7.1 6.2  ALBUMIN 3.7 3.3*    CBC  Recent Labs Lab 12/20/12 0950 12/20/12 2128 12/21/12 0535  HGB 10.6* 11.1* 10.8*  HCT 31.1* 30.4* 29.3*  WBC 16.9* 13.0* 10.0  PLT 84* 202 203     Recent Labs Lab 12/20/12 1040  TROPONINI <0.30   No results  found for this basename: PROBNP,  in the last 168 hours  Glucose  Recent Labs Lab 12/20/12 0813 12/20/12 2239 12/21/12 0712 12/21/12 1153  GLUCAP 144* 125* 108* 130*    Urinalysis    Component Value Date/Time   COLORURINE YELLOW 12/20/2012 0949   APPEARANCEUR CLEAR 12/20/2012 0949   LABSPEC 1.007 12/20/2012 0949   PHURINE 6.5 12/20/2012 0949   GLUCOSEU NEGATIVE 12/20/2012 0949   HGBUR NEGATIVE 12/20/2012 0949   BILIRUBINUR NEGATIVE 12/20/2012 0949   KETONESUR NEGATIVE 12/20/2012 0949   PROTEINUR NEGATIVE 12/20/2012 0949   UROBILINOGEN 0.2 12/20/2012 0949   NITRITE NEGATIVE 12/20/2012 0949   LEUKOCYTESUR NEGATIVE 12/20/2012 0949    Imaging: Ct  Head Wo Contrast  12/20/2012  *RADIOLOGY REPORT*  Clinical Data: Altered mental status  CT HEAD WITHOUT CONTRAST  Technique:  Contiguous axial images were obtained from the base of the skull through the vertex without contrast.  Comparison: 09/05/2012  Findings: No skull fracture is noted.  Paranasal sinuses and mastoid air cells are unremarkable.  No intracranial hemorrhage, mass effect or midline shift.  Stable cerebral atrophy.  Stable periventricular and patchy subcortical white matter decreased attenuation consistent with chronic small vessel ischemic changes. No acute cortical infarction.  No mass lesion is noted on this unenhanced scan.  IMPRESSION: No acute intracranial abnormality.  Stable atrophy and chronic white matter disease.   Original Report Authenticated By: Natasha Mead, M.D.    Mr [name] Wo Contrast  12/20/2012  *RADIOLOGY REPORT*  Clinical Data: Altered mental status.  Episode of unresponsiveness this morning, now resolved. History of diabetes and hypertension.  MRI HEAD WITHOUT AND WITH CONTRAST  Technique:  Multiplanar, multiecho pulse sequences of the brain and surrounding structures were obtained according to standard protocol without and with intravenous contrast  Contrast:  MultiHance 15 ml  Comparison: CT head earlier today.  Findings: There is no evidence for acute infarction, intracranial hemorrhage, mass lesion, hydrocephalus, or extra-axial fluid. Moderate to severe atrophy is present.  Advanced chronic microvascular ischemic change is present in the periventricular and subcortical white matter.  Pituitary and cerebellar tonsils unremarkable.  No osseous lesions. Post infusion, no abnormal intracranial enhancement.  Negative orbits, and mastoids. Air-fluid level right maxillary sinus.  Major intracranial vascular structures appear widely patent.  Similar appearance to priors.  Motion degraded sagittal T1-weighted images as well as coronal T1 and T2-weighted images suggest degenerative  change at the occiput- C1 and C1-C2 interspaces.  There may be slight pannus surrounding the odontoid.  There is no definite acute fracture, but I cannot exclude mild stenosis or an old injury of the craniocervical junction.  Consider noncontrast CT or MRI of the cervical spine when the patient is stable.  IMPRESSION: Atrophy and chronic microvascular ischemic change.  No acute intracranial findings.  Suspected degenerative change of the occiput-C1 and C1-2 interspaces.  Consider further evaluation with CT or MRI of the cervical spine.   Original Report Authenticated By: Davonna Belling, M.D.      EKG Normal EKG at 70bpm   ASSESSMENT AND PLAN   This is a 72 y.o. year old female with the following problems: Current acute issues - altered mental status, now resolved, etiology questionable, multifactorial. - alcoholism - hypomagnesemia, chronic  Chronic underlying issues - Hyponatremia, chronic - Hypertension - B12 deficiency - Depression - Diabetes with neuropathy - Memory impairment   I agree with the plan of the resident team with the following additions/thoughts:  So far regarding her AMS, we have considered  seizures, stroke, cardiac-related syncope, intoxication, deconditioning due to alcohol, wernickes, acute on chronic hyponatremia as possible causes. Neurology ruled out a stroke, and worked her up for seizures. CT is negative for acute pathology. MRI is negative for acute pathology. She has a normal EEG. She has had no events on telemetry, and her EKG is normal. Her increased CKMB could be due to seizures, though we will trend it. TSH is within normal limits. We have started thiamine, folic acid and multivitamin pills. Orthostatics are inconclusive, the patient denies postural dizziness. She has hypomagnesemia, likely chronic, and we will replete magnesium orally.   On a more chronic note, she might be experiencing symptoms of Korsakoff's syndrome with malnutrition, memory impairment and  peripheral neuropathy.   I think she is okay to be discharged with adequate follow up with neurology and pcp, and social work for alcohol problem.  Other chronic issues per resident note.    Thanks, Aletta Edouard, MD 3/5/201412:43 PM

## 2012-12-21 NOTE — Clinical Social Work Psychosocial (Signed)
Clinical Social Work Department BRIEF PSYCHOSOCIAL ASSESSMENT 12/21/2012  Patient:  [name], [name]     Account Number:  [id number]     Admit date:  12/20/2012  Clinical Social Worker:  Peggyann Shoals  Date/Time:  12/21/2012 04:33 PM  Referred by:  Physician  Date Referred:  12/21/2012 Referred for  Substance Abuse   Other Referral:   Interview type:  Patient Other interview type:    PSYCHOSOCIAL DATA Living Status:  HUSBAND Admitted from facility:   Level of care:   Primary support name:  Burlene Arnt Primary support relationship to patient:  SPOUSE Degree of support available:   Supportive    CURRENT CONCERNS Current Concerns  Substance Abuse   Other Concerns:    SOCIAL WORK ASSESSMENT / PLAN CSW met with pt to address consult. CSW introduced herself and explained role of social work. CSW confirmed pt will be returning home at [place]car[place] and pt's husband will be providing transportation.    CSW completed SBIRT.    Pt endorses ETOH use and drinks 6-7 beers almost daily. Pt denies the use of illicit drugs and other forms of ETOH. Pt shared that she does not drive. Pt also stated that needs to cu down on her drinking and feels that she is able to this on her own. CSW provided psychosocial education on substance abuse. Pt was accepting of resources, in the event that she feels that she needs assistance.    Pt is ready for discharge today and will return home   Assessment/plan status:  Psychosocial Support/Ongoing Assessment of Needs Other assessment/ plan:   Information/referral to community resources:   Substance Abuse resources.    PATIENT'S/FAMILY'S RESPONSE TO PLAN OF CARE: Pt was very pleasant and thankful for resources. Pt feels that she is able to stop drinking on her own; however, pt is aware of available resources and knows how to access them.    Dede Query, MSW, Theresia Majors [phone]

## 2012-12-22 ENCOUNTER — Other Ambulatory Visit: Payer: Self-pay | Admitting: *Deleted

## 2012-12-22 MED ORDER — NEBIVOLOL HCL 10 MG PO TABS: 10.0000 mg | ORAL_TABLET | Freq: Every day | ORAL | Status: DC

## 2012-12-22 NOTE — Care Management Note (Signed)
    Page 1 of 1   12/22/2012     8:09:42 AM   CARE MANAGEMENT NOTE 12/22/2012  Patient:  [name], [name]   Account Number:  [id number]  Date Initiated:  12/21/2012  Documentation initiated by:  [facility]  Subjective/Objective Assessment:   admitted with AMS, hyponatremia     Action/Plan:   referral to CSW due to hx alcoholism, elevated alcohol on admission   Anticipated DC Date:  12/24/2012   Anticipated DC Plan:  HOME/SELF CARE  In-house referral  Clinical Social Worker      DC Planning Services  CM consult      Choice offered to / List presented to:             Status of service:  Completed, signed off Medicare Important Message given?   (If response is "NO", the following Medicare IM given date fields will be blank) Date Medicare IM given:   Date Additional Medicare IM given:    Discharge Disposition:  HOME/SELF CARE  Per UR Regulation:  Reviewed for med. necessity/level of care/duration of stay  If discussed at Long Length of Stay Meetings, dates discussed:    Comments:

## 2012-12-22 NOTE — Telephone Encounter (Signed)
Fax Received. Refill Completed. Octavia Chowoe (R.M.A)   

## 2012-12-26 NOTE — Discharge Summary (Signed)
Internal Medicine Teaching Service Attending Note Date: 12/26/2012  Patient name: [name]  Medical record number: [id number]  Date of birth: 1941/07/16    I evaluated the patient on the day of discharge and discussed the discharge plan with my resident team. I agree with the discharge documentation and disposition.  Thank you for working with me on this patient's care.   Thanks Aletta Edouard 12/26/2012, 3:45 PM

## 2012-12-27 ENCOUNTER — Other Ambulatory Visit: Payer: Self-pay | Admitting: *Deleted

## 2012-12-27 MED ORDER — HYDROCODONE-ACETAMINOPHEN 5-325 MG PO TABS: 1.0000 | ORAL_TABLET | Freq: Three times a day (TID) | ORAL | Status: DC | PRN

## 2012-12-27 NOTE — Telephone Encounter (Signed)
Hydrocodone refilled from fax request.

## 2013-01-03 ENCOUNTER — Ambulatory Visit: Payer: Self-pay | Admitting: Family Medicine

## 2013-01-10 ENCOUNTER — Ambulatory Visit: Payer: Medicare Other | Admitting: Family Medicine

## 2013-01-16 ENCOUNTER — Telehealth: Payer: Self-pay

## 2013-01-16 ENCOUNTER — Other Ambulatory Visit: Payer: Self-pay | Admitting: Family Medicine

## 2013-01-16 MED ORDER — PROMETHAZINE HCL 25 MG PO TABS: 25.0000 mg | ORAL_TABLET | Freq: Four times a day (QID) | ORAL | Status: DC | PRN

## 2013-01-16 MED ORDER — SIMVASTATIN 80 MG PO TABS: ORAL_TABLET | ORAL | Status: DC

## 2013-01-16 MED ORDER — METFORMIN HCL 500 MG PO TABS: ORAL_TABLET | ORAL | Status: DC

## 2013-01-16 MED ORDER — VENLAFAXINE HCL 37.5 MG PO TABS: 37.5000 mg | ORAL_TABLET | Freq: Two times a day (BID) | ORAL | Status: DC

## 2013-01-16 MED ORDER — LISINOPRIL 10 MG PO TABS: 10.0000 mg | ORAL_TABLET | Freq: Every day | ORAL | Status: DC

## 2013-01-16 NOTE — Telephone Encounter (Signed)
DR MCPHERSON,  PT DROPPED OFF LIST OF MED REFILLS THAT SHE NEEDS. I PUT THE LIST ON YOUR DESK ON Monday A.M.  Darl Pikes

## 2013-01-17 ENCOUNTER — Telehealth: Payer: Self-pay | Admitting: Radiology

## 2013-01-17 MED ORDER — SIMVASTATIN 40 MG PO TABS: 40.0000 mg | ORAL_TABLET | Freq: Every day | ORAL | Status: DC

## 2013-01-17 NOTE — Progress Notes (Signed)
LMOM for pt that Rxs were sent and correct dose of Metformin. Asked for CB if any ?s.

## 2013-01-17 NOTE — Telephone Encounter (Signed)
Medicare will not let pharmacy process the Zocor 80 mg 1/2 tablet, pharmacy has sent through as Zocor 40, she only has to pay $1 for this. Have corrected in chart. Avis Tirone

## 2013-02-10 ENCOUNTER — Ambulatory Visit (INDEPENDENT_AMBULATORY_CARE_PROVIDER_SITE_OTHER): Payer: Medicare Other | Admitting: Family Medicine

## 2013-02-10 ENCOUNTER — Encounter: Payer: Self-pay | Admitting: Family Medicine

## 2013-02-10 VITALS — BP 167/93 | HR 83 | Temp 96.7°F | Resp 16 | Ht 64.0 in | Wt 156.0 lb

## 2013-02-10 DIAGNOSIS — E119 Type 2 diabetes mellitus without complications: Secondary | ICD-10-CM

## 2013-02-10 DIAGNOSIS — R748 Abnormal levels of other serum enzymes: Secondary | ICD-10-CM

## 2013-02-10 DIAGNOSIS — F1020 Alcohol dependence, uncomplicated: Secondary | ICD-10-CM

## 2013-02-10 DIAGNOSIS — D638 Anemia in other chronic diseases classified elsewhere: Secondary | ICD-10-CM

## 2013-02-10 DIAGNOSIS — E871 Hypo-osmolality and hyponatremia: Secondary | ICD-10-CM

## 2013-02-10 LAB — POCT GLYCOSYLATED HEMOGLOBIN (HGB A1C): Hemoglobin A1C: 5.5

## 2013-02-10 MED ORDER — SIMVASTATIN 40 MG PO TABS: 40.0000 mg | ORAL_TABLET | Freq: Every day | ORAL | Status: DC

## 2013-02-10 MED ORDER — LISINOPRIL 10 MG PO TABS: 10.0000 mg | ORAL_TABLET | Freq: Every day | ORAL | Status: DC

## 2013-02-10 MED ORDER — GABAPENTIN 300 MG PO CAPS: 300.0000 mg | ORAL_CAPSULE | Freq: Two times a day (BID) | ORAL | Status: DC

## 2013-02-10 MED ORDER — VENLAFAXINE HCL 37.5 MG PO TABS: 37.5000 mg | ORAL_TABLET | Freq: Two times a day (BID) | ORAL | Status: DC

## 2013-02-10 MED ORDER — METFORMIN HCL 500 MG PO TABS: ORAL_TABLET | ORAL | Status: DC

## 2013-02-10 NOTE — Patient Instructions (Addendum)
We have discussed chronic alcohol use; you have stated that you will work on reducing use to just 2 beers when you decide to drink. Good nutrition and healthy food choices are very important.  I will see you again in 3 months; if you have any new concerns or questions that need to be addressed, do not hesitate to contact the office.

## 2013-02-10 NOTE — Progress Notes (Signed)
S:  This 72 y.o. Cauc female is here post- hospitalization on December 20, 2012. Husband states pt was only in hospital for one day. That encounter revealed chronic excessive alcohol intake which had not been disclosed previously. (Alcohol intoxication is most likely explanation for electrolytes abnormalities, cognitive decline and frequent falls).  Upon discharge from hospital, inpatient detox/ treatment was recommended but pt not willing to pursue this course. Pt has not abstained; she admits to almost daily consumption of ~4 beer daily; this is a reduction from 6 beers/day. Appetite is fair; pt is compliant with medications but often takes them on empty stomach, causing nausea. She is not taking Thiamine; it was not given to them at pharmacy when other medications were filled.   Patient Active Problem List  Diagnosis  . Type II diabetes mellitus  . HTN (hypertension)  . Depression with anxiety  . Hypercholesteremia  . Chronic pain syndrome  . Lumbar postlaminectomy syndrome  . Lumbar spondylosis  . Diabetic neuropathy, type II diabetes mellitus  . Frequent falls  . Balance disorder  . CAD (coronary artery disease)  . Altered mental status    PMHx and Soc Hx reviewed.  ROS: As per HPI; negative for diaphoresis, vision disturbances, CP or tightness, palpitations, SOB or DOE, cough, n/v or abd pain, change in stools, HA, dizziness, falling, tremors, numbness or syncope.  O: Filed Vitals:   02/10/13 1438  BP: 167/93  Pulse: 83  Temp: 96.7 F (35.9 C)  Resp: 16   GEN: In NAD; WN,WD. HENT: [place]/AT; EOMI w/ clear conj w/o scleral icterus. Oroph clear and moist. COR: RRR; no m/g/r. LUNGS: CTA; no wheezes or rhonchi. Normal resp rate and effort. MS: MAEs; good muscle tone. No c/c/e or deformities. NEURO: A&O x 3; CNs intact. Motor- 4+/5 in upper extremities. Cognition- affect is mildly flat; she is has anxious expression on face. Pt has difficulty concentrating and following  conversation.   CT/ MRI of Brain (12/20/2012): Chronic microvascular ischemic changes and atrophy; cervical spine deg changes.   Results for orders placed in visit on 02/10/13  POCT GLYCOSYLATED HEMOGLOBIN (HGB A1C)      Result Value Range   Hemoglobin A1C 5.5       A/P: Anemia of chronic disease -  12/20/12: H/H = 10.6/ 31.1      Plan: CBC  Alcohol dependence- encouraged limiting intake to 2 beers most days of the week.  Hyponatremia - 3/4//14: NA+ =123 .         Plan: Basic metabolic panel  Abnormal CK -  12/20/12: CK= 230.            Plan: CK  Type II or unspecified type diabetes mellitus without mention of complication, not stated as uncontrolled -  Plan: Continue current dose of Metformin; improve nutrition - eat small snacks at regular intervals.

## 2013-02-11 ENCOUNTER — Telehealth: Payer: Self-pay | Admitting: Family Medicine

## 2013-02-11 DIAGNOSIS — E871 Hypo-osmolality and hyponatremia: Secondary | ICD-10-CM

## 2013-02-11 LAB — CBC
HCT: 35 % — ABNORMAL LOW (ref 36.0–46.0)
Hemoglobin: 11.7 g/dL — ABNORMAL LOW (ref 12.0–15.0)
RDW: 14.7 % (ref 11.5–15.5)
WBC: 7.6 10*3/uL (ref 4.0–10.5)

## 2013-02-11 LAB — BASIC METABOLIC PANEL
Calcium: 9.1 mg/dL (ref 8.4–10.5)
Glucose, Bld: 105 mg/dL — ABNORMAL HIGH (ref 70–99)
Sodium: 118 mEq/L — ABNORMAL LOW (ref 135–145)

## 2013-02-11 LAB — CK: Total CK: 142 U/L (ref 7–177)

## 2013-02-11 NOTE — Telephone Encounter (Signed)
[name] with [facility] called with alert lab. Sodium 118. Was seen by Dr. Audria Nine 02/10/13.

## 2013-02-11 NOTE — Telephone Encounter (Signed)
Please call this patient. How is she feeling?  Please return on Sunday for repeat BMET due to her sodium being even lower 12/13/2012 than the previous check. (future order placed).

## 2013-02-12 ENCOUNTER — Other Ambulatory Visit (INDEPENDENT_AMBULATORY_CARE_PROVIDER_SITE_OTHER): Payer: Medicare Other | Admitting: Family Medicine

## 2013-02-12 DIAGNOSIS — E871 Hypo-osmolality and hyponatremia: Secondary | ICD-10-CM

## 2013-02-12 LAB — BASIC METABOLIC PANEL
CO2: 25 mEq/L (ref 19–32)
Calcium: 9.6 mg/dL (ref 8.4–10.5)
Sodium: 125 mEq/L — ABNORMAL LOW (ref 135–145)

## 2013-02-12 NOTE — Progress Notes (Signed)
Patient here for labs only. 

## 2013-02-12 NOTE — Telephone Encounter (Signed)
Left message for patient to return call. I will try back if she hasn't called in a couple hours

## 2013-02-12 NOTE — Telephone Encounter (Signed)
Please call her back at 4 pm to see if she can arrange a ride. How is she feeling?

## 2013-02-12 NOTE — Telephone Encounter (Signed)
Called patient and they got here a few minutes after we closed. They do not live far and they are coming back in now for labs only. See future order.

## 2013-02-12 NOTE — Progress Notes (Signed)
Quick Note:  Please contact pt and advise that the following labs are abnormal...  Sodium level is still below normal. Pt needs to really limit alcohol (beer) intake. All other labs are normal. Anemia has improved.  Schedule to follow-up in ~ 4 weeks. (Pt has an appt for mid-July).  Copy to pt. ______

## 2013-02-12 NOTE — Telephone Encounter (Signed)
Spoke with patient and she states she does not have a ride. Her husband not there right now and not sure what time he will be back. I advised of office hours and explained importance. She said she would call me when he got back to see if she would be able to make it in.

## 2013-02-13 ENCOUNTER — Encounter: Payer: Self-pay | Admitting: Family Medicine

## 2013-02-22 ENCOUNTER — Other Ambulatory Visit: Payer: Self-pay

## 2013-02-22 MED ORDER — HYDROCODONE-ACETAMINOPHEN 5-325 MG PO TABS: 1.0000 | ORAL_TABLET | Freq: Three times a day (TID) | ORAL | Status: AC | PRN

## 2013-02-28 ENCOUNTER — Encounter: Payer: Self-pay | Admitting: Physical Medicine and Rehabilitation

## 2013-02-28 ENCOUNTER — Encounter
Payer: Medicare Other | Attending: Physical Medicine and Rehabilitation | Admitting: Physical Medicine and Rehabilitation

## 2013-02-28 VITALS — BP 184/87 | HR 104 | Resp 16 | Ht 64.0 in | Wt 153.0 lb

## 2013-02-28 DIAGNOSIS — Z79899 Other long term (current) drug therapy: Secondary | ICD-10-CM

## 2013-02-28 DIAGNOSIS — G8929 Other chronic pain: Secondary | ICD-10-CM | POA: Insufficient documentation

## 2013-02-28 DIAGNOSIS — Z5181 Encounter for therapeutic drug level monitoring: Secondary | ICD-10-CM

## 2013-02-28 DIAGNOSIS — M79609 Pain in unspecified limb: Secondary | ICD-10-CM | POA: Insufficient documentation

## 2013-02-28 DIAGNOSIS — E1149 Type 2 diabetes mellitus with other diabetic neurological complication: Secondary | ICD-10-CM | POA: Insufficient documentation

## 2013-02-28 DIAGNOSIS — R279 Unspecified lack of coordination: Secondary | ICD-10-CM | POA: Insufficient documentation

## 2013-02-28 DIAGNOSIS — M961 Postlaminectomy syndrome, not elsewhere classified: Secondary | ICD-10-CM

## 2013-02-28 DIAGNOSIS — E1142 Type 2 diabetes mellitus with diabetic polyneuropathy: Secondary | ICD-10-CM | POA: Insufficient documentation

## 2013-02-28 NOTE — Patient Instructions (Signed)
Continue with your walking program, do not walk by yourself.

## 2013-02-28 NOTE — Progress Notes (Signed)
Subjective:    Patient ID: [name], female    DOB: [birth year], 72 y.o.   MRN: [id number]  HPI The patient complains about chronic low back pain which radiates into her LE bilateral, posteriorly. The patient also complains about numbness and tingling in both of her entire feet.  The problem has been stable.  She underwent right L2-3-4 radiofrequency September 24, 2011. Results  have been okay, but not as good as before. Her previous RF was  performed January 29, 2011, on the right side. The patient's husband reports, that the patient had an episode were she blacked out, for about 30 min, she was at the ED in [facility], CT and MRI of the brain did not show any new acute findings. Pain Inventory Average Pain 6 Pain Right Now 5 My pain is intermittent  In the last 24 hours, has pain interfered with the following? General activity 5 Relation with others 5 Enjoyment of life 5 What TIME of day is your pain at its worst? evening Sleep (in general) Fair  Pain is worse with: bending and standing Pain improves with: rest, medication and injections Relief from Meds: 7  Mobility walk without assistance how many minutes can you walk? 30 ability to climb steps?  yes do you drive?  no Do you have any goals in this area?  yes  Function retired I need assistance with the following:  meal prep, household duties and shopping  Neuro/Psych weakness numbness tingling trouble walking depression anxiety  Prior Studies Any changes since last visit?  yes CT/MRI  Physicians involved in your care Any changes since last visit?  no   Family History  Problem Relation Age of Onset  . Asthma Sister   . Allergies Sister   . Diabetes Brother   . Hypertension Brother   . Atrial fibrillation Sister    History   Social History  . Marital Status: Married    Spouse Name: N/A    Number of Children: N/A  . Years of Education: N/A   Social History Main Topics  . Smoking status: Former  Smoker -- 2.00 packs/day for 30 years    Types: Cigarettes    Quit date: 10/19/1990  . Smokeless tobacco: None  . Alcohol Use: 2.4 oz/week    4 Cans of beer per week  . Drug Use: No  . Sexually Active: Yes   Other Topics Concern  . None   Social History Narrative  . None   Past Surgical History  Procedure Laterality Date  . Spine surgery      2 back procedures  . Antrectomy      Vagotomy done also for severe peptic ulcer disease  . Vagotomy and pyloroplasty    . Eye surgery      Cataracts   Past Medical History  Diagnosis Date  . Postlaminectomy syndrome, lumbar region   . Disturbance of skin sensation   . Disorders of sacrum   . Hyperlipidemia   . Lumbosacral spondylosis without myelopathy   . Asthma     Situational (environmental triggers)  . Cancer   . Diabetes mellitus   . Depression   . Hypertension   . Ulcer   . Osteoporosis    BP 184/87  Pulse 104  Resp 16  Ht 5\' 4"  (1.626 m)  Wt 153 lb (69.4 kg)  BMI 26.25 kg/m2  SpO2 99%     Review of Systems  Constitutional: Positive for appetite change.  Respiratory: Positive for cough.  Gastrointestinal: Positive for nausea.  Musculoskeletal: Positive for gait problem.  Neurological: Positive for weakness and numbness.  Psychiatric/Behavioral: Positive for dysphoric mood and agitation.  All other systems reviewed and are negative.       Objective:   Physical Exam Constitutional: She is oriented to person, place, and time. She appears well-developed.  Musculoskeletal:  Lumbar back: She exhibits decreased range of motion.  Decreased lumbar extension normal lumbar flexion Straight leg raising test is negative  Neurological: She is alert and oriented to person, place, and time. She has normal strength. A sensory deficit is present.  Numbness in both feet  Psychiatric: Thought content normal. Her affect is normal. Her speech is delayed. She is slowed.She has some difficulties to answer my questions, she  most of the time looks to her husband for support to answer the question, what he then does, most of the time.  Symmetric normal motor tone is noted throughout. Normal muscle bulk. Muscle testing reveals 5/5 muscle strength of the upper extremity, and 5/5 of the lower extremity. Full range of motion in upper and lower extremities. ROM of spine is restricted.  DTR in the upper and lower extremity are present and symmetric 2+, except demenished right patella tendon reflex. No clonus is noted.  Patient arises from chair with difficulty. Wide based gait with decreased arm swing bilateral , able to walk on heels and toes, but difficult to keep balance . Tandem walk is not possible. No pronator drift. Rhomberg positive.        Assessment & Plan:  1. Lumbar postlaminectomy syndrome we'll continue her hydrocodone and increased from 2 tablets per day to 3 tablets per day of the 5 mg dose  2. Diabetic neuropathy. On gabapentin to 300 mg 3 times a day to 4 times a day cautioned on potential drowsiness.  3. Balance disorder, could be coming from diabetic neuropathy, but also positive Rhomberg and slow cognitively, problems with memory. Patient went to Dr. Danae Orleans, neurologist, after I advised patient to talk to her PCP about a possible referral to neurology, to further evaluate her memory and balance problems. He ordered a MRI of her C-spine and L-spine, she was diagnosed with spinal stenosis, per patient.  The patient had an episode were she blacked out, for about 30 min, she was at the ED in [facility], CT and MRI of the brain did not show any new acute findings. The patient still had alcohol in her blood, this was early in the morning. I educated the patient about the risks of mixing her Hydrocodone with alcohol, and also reminded her of her contract with [place]. I told her if we will find alcohol again in another UDS, we will not prescribe any narcotics anymore. The patient understood. Advised patient to follow  up with Dr. Danae Orleans for this episode , to further evaluate.  Advised patient to continue with exercising and walking, with her husband, or another person in a safe way.  Return in 3 months with PA, or earlier if necessary .

## 2013-02-28 NOTE — Addendum Note (Signed)
Addended by: Doreene Eland on: 02/28/2013 02:31 PM   Modules accepted: Orders

## 2013-03-03 IMAGING — CR DG LUMBAR SPINE 2-3V
2 series · 2 of 2 positions shown · non-contrast
Comparison: It

CLINICAL DATA: Back pain.

LUMBAR SPINE - 2-3 VIEW

[oblique]
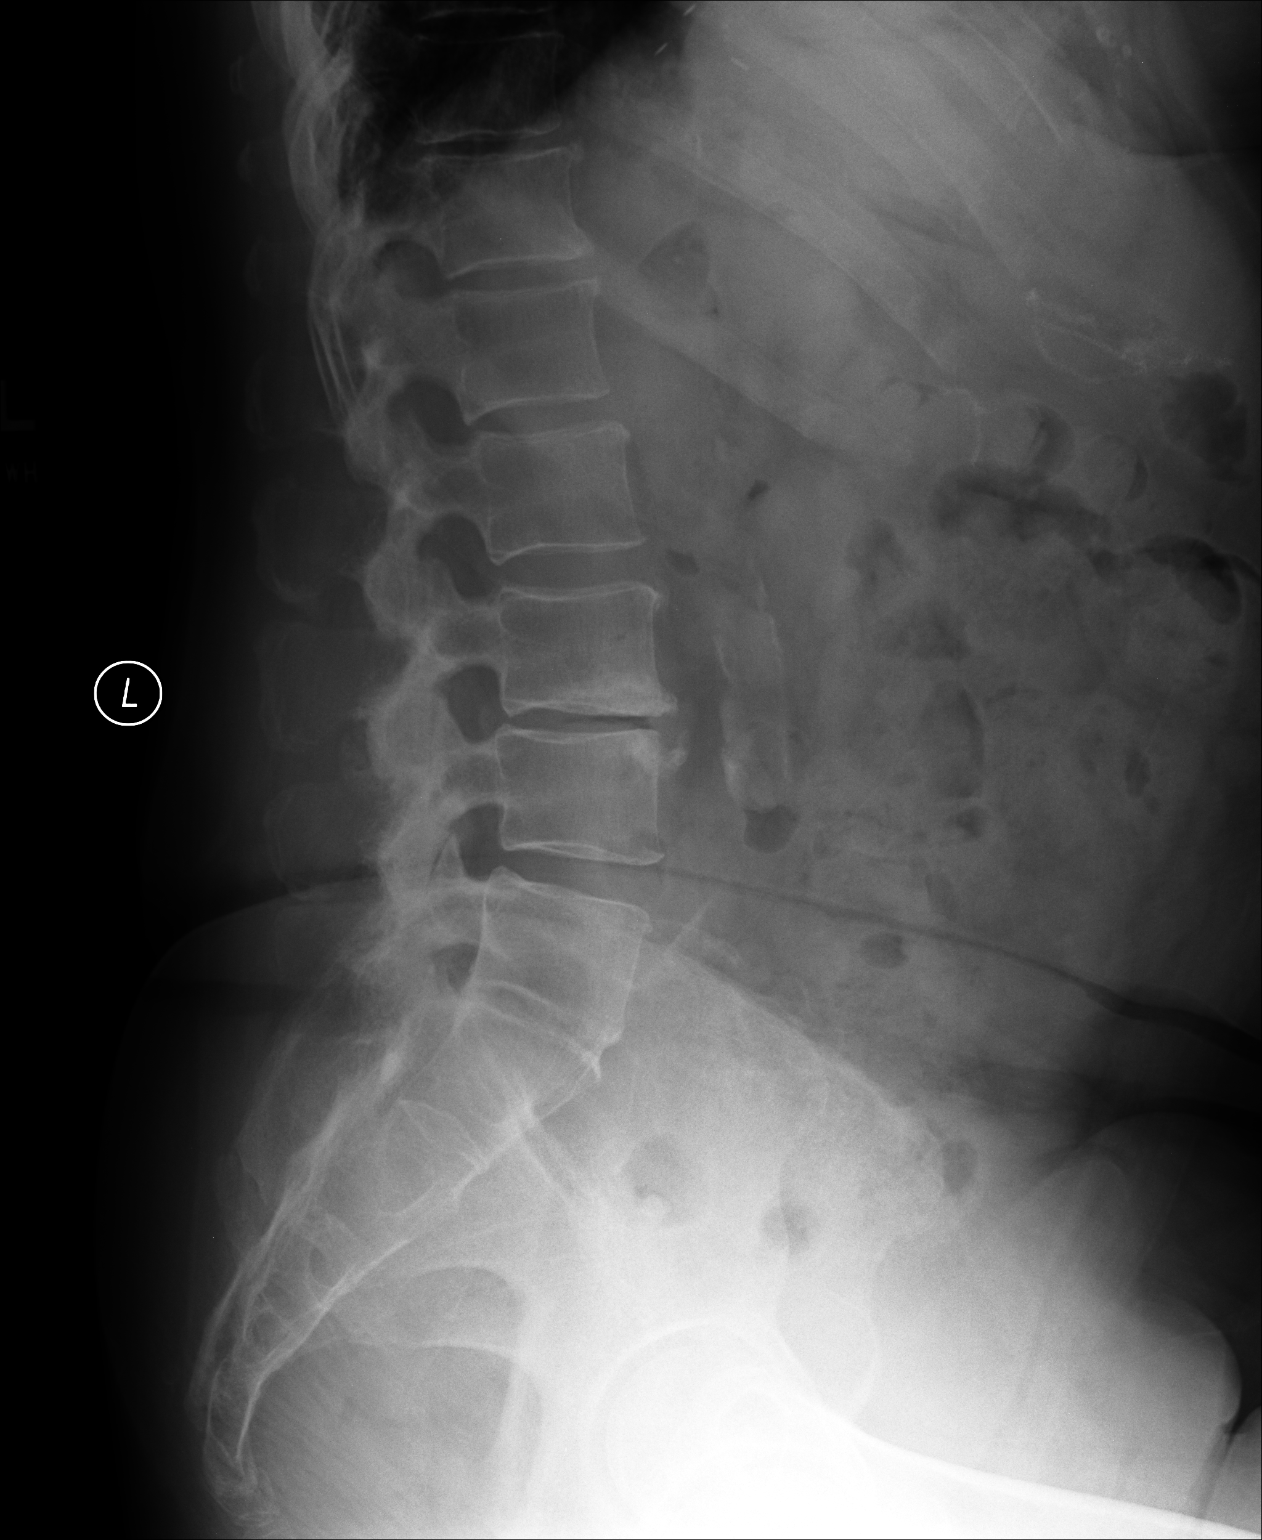

[AP]
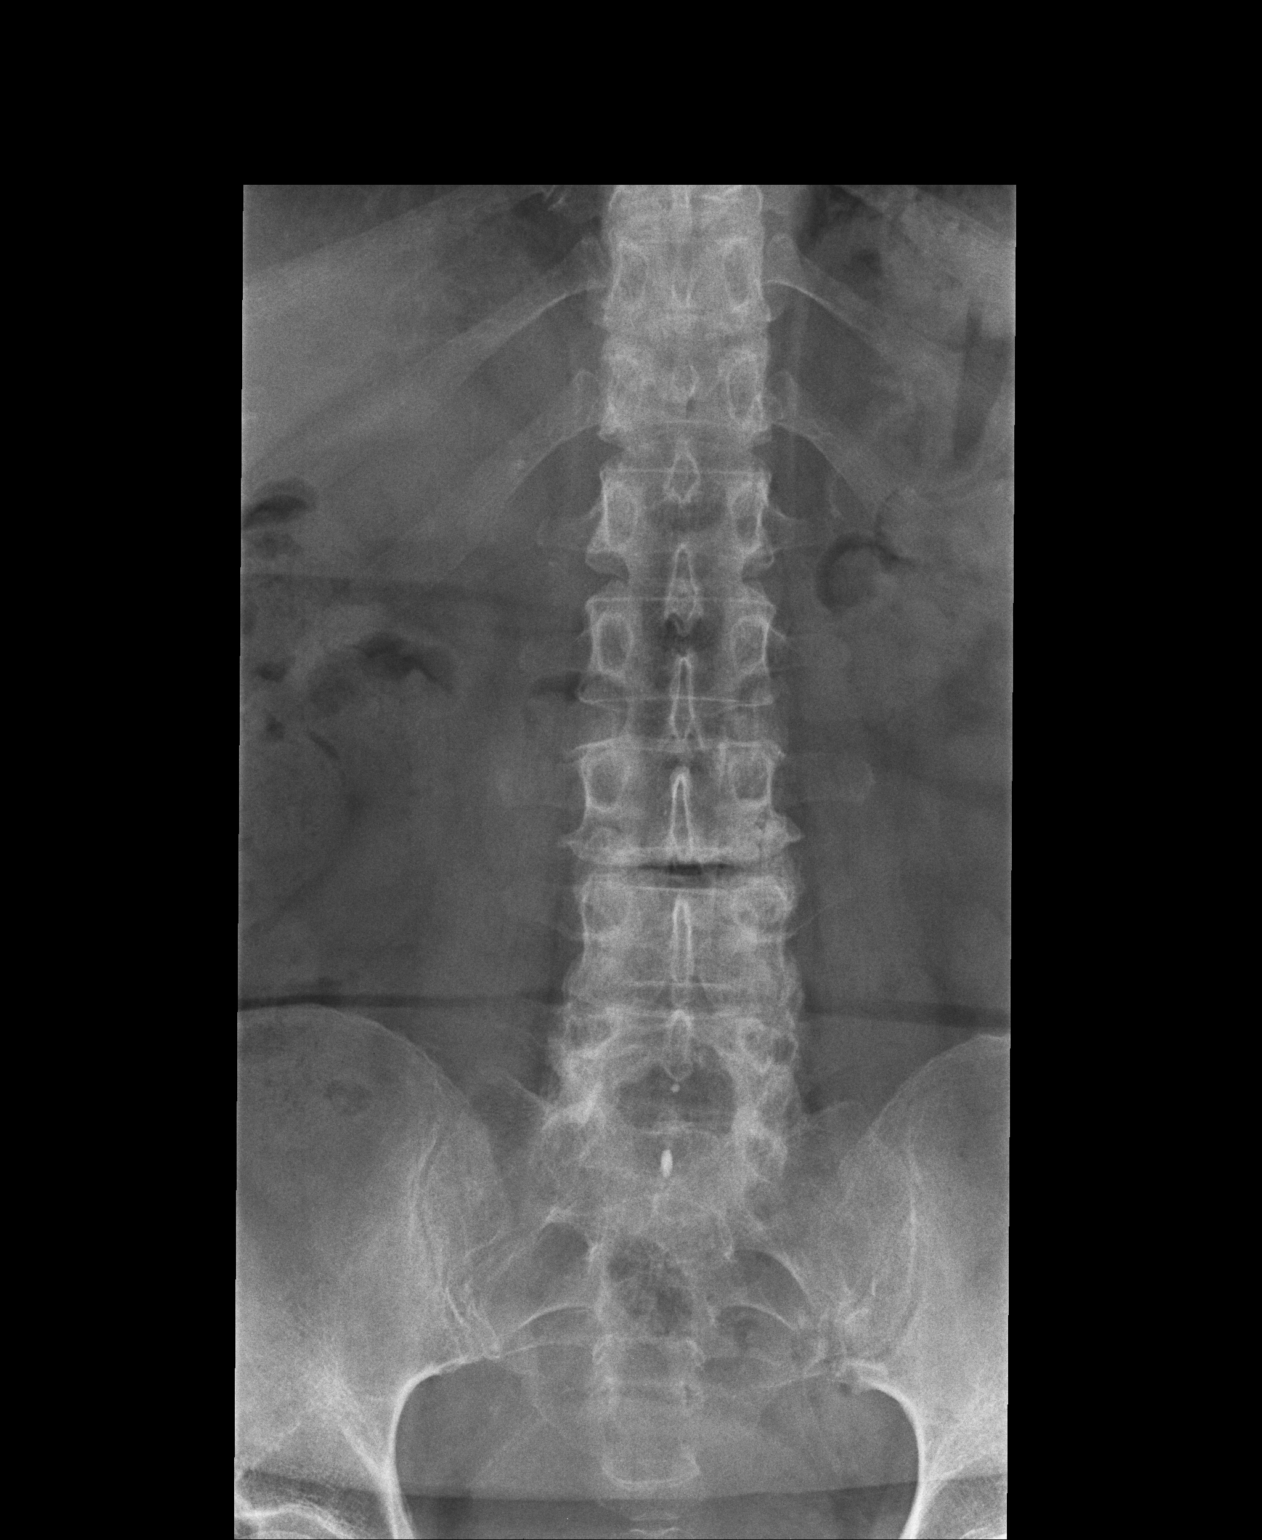

[2 of 2 positions shown; findings below may reference images not displayed]

FINDINGS: Five non-rib bearing lumbar type vertebral bodies are
present.  Endplate degenerative changes and loss of disc height are
noted at L3-4, worse on the right.  There is ankylosis across the
disc space at L5-S1.  The vertebral body heights and alignment are
otherwise maintained.  The other disc spaces are preserved.  Focal
density within the inferior aspect of the spinal canal likely
reflects myelographic contrast (Pantopaque)

Atherosclerotic calcifications are present in the aorta without
aneurysm.  Mild bone demineralization is evident.
IMPRESSION: 1.  Spondylosis of the lumbar spine is most evident at L3-4, worse
on the right.
2.  Ankylosis across the disc space at L5-S1.

## 2013-03-08 ENCOUNTER — Telehealth: Payer: Self-pay

## 2013-03-08 NOTE — Telephone Encounter (Signed)
Message copied by Judd Gaudier on Wed Mar 08, 2013  2:10 PM ------      Message from: Su Monks      Created: Tue Mar 07, 2013  4:05 PM       Please educate patient, if this happens again we will not prescribe narcotics anymore, and we will do a UDS on her next time ------

## 2013-03-08 NOTE — Telephone Encounter (Signed)
Patient educated on alcohol and narcotics and reminded of contract. 

## 2013-04-23 ENCOUNTER — Other Ambulatory Visit: Payer: Self-pay | Admitting: Family Medicine

## 2013-04-25 ENCOUNTER — Other Ambulatory Visit: Payer: Self-pay | Admitting: Family Medicine

## 2013-05-05 ENCOUNTER — Ambulatory Visit (INDEPENDENT_AMBULATORY_CARE_PROVIDER_SITE_OTHER): Payer: Medicare Other | Admitting: Family Medicine

## 2013-05-05 ENCOUNTER — Encounter: Payer: Self-pay | Admitting: Family Medicine

## 2013-05-05 VITALS — BP 146/90 | HR 93 | Temp 98.5°F | Resp 16 | Ht 65.0 in | Wt 153.2 lb

## 2013-05-05 DIAGNOSIS — D509 Iron deficiency anemia, unspecified: Secondary | ICD-10-CM

## 2013-05-05 DIAGNOSIS — E1149 Type 2 diabetes mellitus with other diabetic neurological complication: Secondary | ICD-10-CM

## 2013-05-05 DIAGNOSIS — F1020 Alcohol dependence, uncomplicated: Secondary | ICD-10-CM

## 2013-05-05 DIAGNOSIS — E871 Hypo-osmolality and hyponatremia: Secondary | ICD-10-CM

## 2013-05-05 DIAGNOSIS — E559 Vitamin D deficiency, unspecified: Secondary | ICD-10-CM

## 2013-05-05 LAB — BASIC METABOLIC PANEL
BUN: 7 mg/dL (ref 6–23)
Calcium: 9.3 mg/dL (ref 8.4–10.5)
Glucose, Bld: 125 mg/dL — ABNORMAL HIGH (ref 70–99)
Sodium: 127 mEq/L — ABNORMAL LOW (ref 135–145)

## 2013-05-05 LAB — CBC WITH DIFFERENTIAL/PLATELET
Basophils Absolute: 0.1 10*3/uL (ref 0.0–0.1)
Lymphocytes Relative: 11 % — ABNORMAL LOW (ref 12–46)
Neutro Abs: 7.2 10*3/uL (ref 1.7–7.7)
Platelets: 271 10*3/uL (ref 150–400)
RDW: 14.5 % (ref 11.5–15.5)
WBC: 9.7 10*3/uL (ref 4.0–10.5)

## 2013-05-05 MED ORDER — VENLAFAXINE HCL 37.5 MG PO TABS: 37.5000 mg | ORAL_TABLET | Freq: Two times a day (BID) | ORAL | Status: AC

## 2013-05-05 MED ORDER — GABAPENTIN 300 MG PO CAPS: 300.0000 mg | ORAL_CAPSULE | Freq: Two times a day (BID) | ORAL | Status: AC

## 2013-05-05 MED ORDER — ONDANSETRON HCL 4 MG PO TABS: 4.0000 mg | ORAL_TABLET | Freq: Two times a day (BID) | ORAL | Status: AC | PRN

## 2013-05-05 MED ORDER — METFORMIN HCL 500 MG PO TABS: ORAL_TABLET | ORAL | Status: AC

## 2013-05-05 MED ORDER — SIMVASTATIN 40 MG PO TABS: 40.0000 mg | ORAL_TABLET | Freq: Every day | ORAL | Status: AC

## 2013-05-05 MED ORDER — LISINOPRIL 10 MG PO TABS: 10.0000 mg | ORAL_TABLET | Freq: Every day | ORAL | Status: AC

## 2013-05-05 NOTE — Patient Instructions (Addendum)
We have reviewed all your medications and discussed medications what you should be taking. I want you to improve your nutrition by having some yogurt every day along with some fruits and vegetables. If you do not feel like eating, drink a GLUCERNA meal supplement- this is a liquid meal substitute that is good for Diabetics (low sugar content).   You have agreed to exercise on the stationary bicycle for 5 minutes every morning and 5 minutes every afternoon. When the weather cools off, you and your husband can go for short walks 5-10 minutes and enjiy being outside. Experiencing NATURE is good for the MIND and SPIRIT!  I would ask that you limit beer intake to no more than 2 per day. If you are eating with your drink, it is absorbed more slowly.  I have changed the medication for nausea to Ondansetron (Zofran is the brand). You can take 1 tablet 2 times a day as needed for nausea. Keep some Diet Ginger Ale ibn the home and drink it (slightly chilled) when you have nausea.

## 2013-05-08 ENCOUNTER — Encounter: Payer: Self-pay | Admitting: Family Medicine

## 2013-05-08 ENCOUNTER — Telehealth: Payer: Self-pay | Admitting: Family Medicine

## 2013-05-08 NOTE — Telephone Encounter (Signed)
I called pt's home to report lab results. Spoke with pt's husband. All labs are slightly improved from 2 months ago. Pt no longer anemic. Husband will pass this information on to the pt.

## 2013-05-08 NOTE — Progress Notes (Signed)
S:  This 72  Y.o. Cauc female has HTN, Type II DM and hx of alcohol dependence; this has resulted in chronic hyponatremia and anemia. Pt and husband report medication compliance; pt has actually discontinued some OTC vitamin supplements. Husband is encouraging better nutrition and some physical activity. FSBS are below 150; no hypoglycemia reported. Pt continues to consume beer on a daily basis.  Pt is receiving treatment for chronic lumbar [facility]/ last visit about 2 months ago. She has not fallen recently; balance seems to be a little better according to her husband. She can be unsteady on her feet if she moves to quickly or tries to turn around to fast.  PMHx, Soc Hx and Problem List reviewed.  ROS: As per HPI; negative for fever/ chills, diaphoresis, CP or tightness, palpitations, edema, SOB, cough, apnea, n/v/d or constipation, increasing back or lower ext pain or weakness, HA, dizziness, seizure, tremors or syncope. She still has some cognitive problems but no new reports of sleep disturbances, agitation, hallucinations or self-harm.   O:  Filed Vitals:   05/05/13 1432  BP: 146/90  Pulse: 93  Temp: 98.5 F (36.9 C)  Resp: 16   GEN: In NAD; WN,WD. Weight down 3 lb from April 2014. HENT: [place]/AT; EOMI w/ clear conj/ sclerae. EACs/nose/oroph unremarkable. COR: RRR. Trace pretibial edema. LUNGS: Normal resp rate and effort. SKIN: W&D. No jaundice, ecchymoses or pallor. MS: MAEs; no obvious muscle strophy or joint deformities. NEURO: A&O x 3; CNs intact. Rapidly-alternating movements- fair. Gait- slightly unsteady and wide-based.  A1c= 5.4%   A/P: Type II or unspecified type diabetes mellitus with neurological manifestations, not stated as uncontrolled(250.60) - Stable. Improve nutritional intake.    Plan: POCT glycosylated hemoglobin (Hb A1C)  Hyponatremia (chronic) - Plan: Basic metabolic panel  Alcohol dependence - Encouraged limiting intake  to 2 beers/day. Husband will try  To monitor this.  Iron deficiency anemia, unspecified - Plan: CBC with Differential  Unspecified vitamin D deficiency   Meds ordered this encounter  Medications  . ergocalciferol (VITAMIN D2) 50000 UNITS capsule    Sig: Take 50,000 Units by mouth once a week.  . ondansetron (ZOFRAN) 4 MG tablet    Sig: Take 1 tablet (4 mg total) by mouth every 12 (twelve) hours as needed for nausea.    Dispense:  30 tablet    Refill:  1  . metFORMIN (GLUCOPHAGE) 500 MG tablet    Sig: Take 1/2 tablet by mouth twice a day with meals.    Dispense:  30 tablet    Refill:  5  . simvastatin (ZOCOR) 40 MG tablet    Sig: Take 1 tablet (40 mg total) by mouth at bedtime.    Dispense:  30 tablet    Refill:  5  . venlafaxine (EFFEXOR) 37.5 MG tablet    Sig: Take 1 tablet (37.5 mg total) by mouth 2 (two) times daily.    Dispense:  60 tablet    Refill:  5  . gabapentin (NEURONTIN) 300 MG capsule    Sig: Take 1 capsule (300 mg total) by mouth 2 (two) times daily.    Dispense:  60 capsule    Refill:  5  . lisinopril (PRINIVIL,ZESTRIL) 10 MG tablet    Sig: Take 1 tablet (10 mg total) by mouth daily.    Dispense:  30 tablet    Refill:  5

## 2013-05-31 ENCOUNTER — Encounter: Payer: Self-pay | Admitting: Physical Medicine and Rehabilitation

## 2013-05-31 ENCOUNTER — Encounter
Payer: Medicare Other | Attending: Physical Medicine and Rehabilitation | Admitting: Physical Medicine and Rehabilitation

## 2013-05-31 VITALS — BP 182/99 | HR 98 | Resp 14 | Ht 64.0 in | Wt 152.6 lb

## 2013-05-31 DIAGNOSIS — M545 Low back pain, unspecified: Secondary | ICD-10-CM | POA: Insufficient documentation

## 2013-05-31 DIAGNOSIS — R279 Unspecified lack of coordination: Secondary | ICD-10-CM | POA: Insufficient documentation

## 2013-05-31 DIAGNOSIS — G8929 Other chronic pain: Secondary | ICD-10-CM | POA: Insufficient documentation

## 2013-05-31 DIAGNOSIS — M47817 Spondylosis without myelopathy or radiculopathy, lumbosacral region: Secondary | ICD-10-CM

## 2013-05-31 DIAGNOSIS — E1142 Type 2 diabetes mellitus with diabetic polyneuropathy: Secondary | ICD-10-CM | POA: Insufficient documentation

## 2013-05-31 DIAGNOSIS — E1149 Type 2 diabetes mellitus with other diabetic neurological complication: Secondary | ICD-10-CM | POA: Insufficient documentation

## 2013-05-31 DIAGNOSIS — R209 Unspecified disturbances of skin sensation: Secondary | ICD-10-CM | POA: Insufficient documentation

## 2013-05-31 DIAGNOSIS — M961 Postlaminectomy syndrome, not elsewhere classified: Secondary | ICD-10-CM

## 2013-05-31 NOTE — Progress Notes (Signed)
Subjective:    Patient ID: [name], female    DOB: 07/02/1941, 72 y.o.   MRN: [id number]  HPI The patient complains about chronic low back pain which radiates into her LE bilateral, posteriorly. The patient also complains about numbness and tingling in both of her entire feet.  The problem has been stable.  She underwent right L2-3-4 radiofrequency September 24, 2011. Results  have been okay, but not as good as before. Her previous RF was  performed January 29, 2011, on the right side.  The patient's husband reports, that the patient had an episode were she blacked out, for about 30 min, she was at the ED in [facility] March 2014. CT and MRI of the brain did not show any new acute findings. No new medical issues since last visit.  Pain Inventory Average Pain 4 Pain Right Now 4 My pain is intermittent  In the last 24 hours, has pain interfered with the following? General activity 4 Relation with others 4 Enjoyment of life 5 What TIME of day is your pain at its worst? evening Sleep (in general) Good  Pain is worse with: bending Pain improves with: rest and medication Relief from Meds: 7  Mobility walk without assistance how many minutes can you walk? 20 ability to climb steps?  yes do you drive?  no needs help with transfers Do you have any goals in this area?  yes  Function not employed: date last employed na I need assistance with the following:  meal prep, household duties and shopping  Neuro/Psych weakness numbness spasms depression  Prior Studies Any changes since last visit?  no  Physicians involved in your care Any changes since last visit?  no   Family History  Problem Relation Age of Onset  . Asthma Sister   . Allergies Sister   . Diabetes Brother   . Hypertension Brother   . Atrial fibrillation Sister    History   Social History  . Marital Status: Married    Spouse Name: N/A    Number of Children: N/A  . Years of Education: N/A    Social History Main Topics  . Smoking status: Former Smoker -- 2.00 packs/day for 30 years    Types: Cigarettes    Quit date: 10/19/1990  . Smokeless tobacco: None  . Alcohol Use: 2.4 oz/week    4 Cans of beer per week  . Drug Use: No  . Sexual Activity: Yes   Other Topics Concern  . None   Social History Narrative  . None   Past Surgical History  Procedure Laterality Date  . Spine surgery      2 back procedures  . Antrectomy      Vagotomy done also for severe peptic ulcer disease  . Vagotomy and pyloroplasty    . Eye surgery      Cataracts   Past Medical History  Diagnosis Date  . Postlaminectomy syndrome, lumbar region   . Disturbance of skin sensation   . Disorders of sacrum   . Hyperlipidemia   . Lumbosacral spondylosis without myelopathy   . Asthma     Situational (environmental triggers)  . Cancer   . Diabetes mellitus   . Depression   . Hypertension   . Ulcer   . Osteoporosis    BP 182/99  Pulse 98  Resp 14  Ht 5\' 4"  (1.626 m)  Wt 152 lb 9.6 oz (69.219 kg)  BMI 26.18 kg/m2  SpO2 97%  Review of Systems  Neurological: Positive for weakness and numbness.       Spasms  Psychiatric/Behavioral: Positive for dysphoric mood.  All other systems reviewed and are negative.       Objective:   Physical Exam Constitutional: She is oriented to person, place, and time. She appears well-developed.  Musculoskeletal:  Lumbar back: She exhibits decreased range of motion.  Decreased lumbar extension normal lumbar flexion Straight leg raising test is negative  Neurological: She is alert and oriented to person, place, and time. She has normal strength. A sensory deficit is present.  Numbness in both feet  Psychiatric: Thought content normal. Her affect is normal. Her speech is delayed. She is slowed.She has some difficulties to answer my questions, she most of the time looks to her husband for support to answer the question, what he then does, most of the  time.  Symmetric normal motor tone is noted throughout. Normal muscle bulk. Muscle testing reveals 5/5 muscle strength of the upper extremity, and 5/5 of the lower extremity. Full range of motion in upper and lower extremities. ROM of spine is restricted.  DTR in the upper and lower extremity are present and symmetric 2+, except demenished right patella tendon reflex. No clonus is noted.  Patient arises from chair with difficulty. Wide based gait with decreased arm swing bilateral , able to walk on heels and toes, but difficult to keep balance . Tandem walk is not possible. No pronator drift. Rhomberg positive.        Assessment & Plan:  1. Lumbar postlaminectomy syndrome we'll continue her hydrocodone and increased from 2 tablets per day to 3 tablets per day of the 5 mg dose  2. Diabetic neuropathy. On gabapentin to 300 mg 3 times a day to 4 times a day cautioned on potential drowsiness.  3. Balance disorder, could be coming from diabetic neuropathy, but also positive Rhomberg and slow cognitively, problems with memory. Patient went to Dr. Danae Orleans, neurologist, after I advised patient to talk to her PCP about a possible referral to neurology, to further evaluate her memory and balance problems. He ordered a MRI of her C-spine and L-spine, she was diagnosed with spinal stenosis, per patient.  The patient had an episode in March 2014, were she blacked out, for about 30 min, she was at the ED in [facility], CT and MRI of the brain did not show any new acute findings. The patient still had alcohol in her blood, this was early in the morning. I educated the patient about the risks of mixing her Hydrocodone with alcohol, and also reminded her of her contract with [place]. I told her if we will find alcohol again in another UDS, we will not prescribe any narcotics anymore. The patient understood.  Advised patient to follow up with Dr. Danae Orleans for this episode , to further evaluate.  Advised patient to continue  with exercising and walking, with her husband, or another person in a safe way.  Return in 3 months with PA, or earlier if necessary

## 2013-05-31 NOTE — Patient Instructions (Signed)
Restart your walking program, try to start aquatic exercising/Swimming if possible

## 2013-06-29 ENCOUNTER — Telehealth: Payer: Self-pay

## 2013-06-30 NOTE — Telephone Encounter (Signed)
Spoke to spouse. Scheduled appt.  

## 2013-07-06 ENCOUNTER — Ambulatory Visit (INDEPENDENT_AMBULATORY_CARE_PROVIDER_SITE_OTHER): Payer: Medicare Other | Admitting: Nurse Practitioner

## 2013-07-06 ENCOUNTER — Encounter: Payer: Self-pay | Admitting: Nurse Practitioner

## 2013-07-06 VITALS — BP 168/116 | HR 108

## 2013-07-06 DIAGNOSIS — F1027 Alcohol dependence with alcohol-induced persisting dementia: Secondary | ICD-10-CM

## 2013-07-06 DIAGNOSIS — Z7142 Counseling for family member of alcoholic: Secondary | ICD-10-CM

## 2013-07-06 DIAGNOSIS — F329 Major depressive disorder, single episode, unspecified: Secondary | ICD-10-CM

## 2013-07-06 DIAGNOSIS — M48061 Spinal stenosis, lumbar region without neurogenic claudication: Secondary | ICD-10-CM

## 2013-07-06 DIAGNOSIS — F3289 Other specified depressive episodes: Secondary | ICD-10-CM

## 2013-07-06 DIAGNOSIS — Z7189 Other specified counseling: Secondary | ICD-10-CM

## 2013-07-06 DIAGNOSIS — F32A Depression, unspecified: Secondary | ICD-10-CM

## 2013-07-06 DIAGNOSIS — R269 Unspecified abnormalities of gait and mobility: Secondary | ICD-10-CM

## 2013-07-06 NOTE — Progress Notes (Signed)
[facility]  PATIENT: [name] DOB: 12/10/1940   REASON FOR VISIT: follow up HISTORY FROM: spouse, patient non-contributory  HISTORY OF PRESENT ILLNESS: Chief Complaint: memory, gait diff    11/16/12 PRIOR HPI: 72 year old right-handed female with history of hypertension, diabetes, hypercholesterolemia, depression, chronic alcohol use (4 beers per day x50 years) here for evaluation of memory and gait problems with past one year.  When asked the patient why she is here she told me that she has been falling down. She looked her husband for advice on how to answer questions. She has a vacant appearance and seems confused on historical events. At one point conversation she remembers that she has memory problems as well. She's not sure how long she's been having this.  According to the husband she had multiple falls in summer of 2013. She fell again and October and November 2013, a one-time resulting in loss of consciousness. She's gone through physical therapy for reconditioning. Patient feels like she staggers quite a bit and sometimes falls backwards. She was recommended to use a walker, but she does not use it. Patient's husband reports that she has been diagnosed with diabetic neuropathy.  Regarding memory and cognitive problems, she's had increasing problems with short-term memory. She tends to forget what day it is and we'll repeatedly asked the same question over and over to her husband. She is misplacing objects and forgetting recent conversations.  Regarding activities daily living, she is able to bathe herself, feed herself and dress herself. She has not done much else over the past 20 years due to chronic low back pain (status post lumbar spine surgery x2). Husband takes care of most of the housework, cooking, cleaning, driving. Apparently she still pays some bills.  12/20/12   73 years old female brought to the hospital after sustaining a rather prolonged  episode of acute alteration of consciousness with bladder and bowel incontinence. CT and MRI unremarkable. EEG showed no epileptiform activity. Upon discharge from hospital, inpatient detox/ treatment was recommended but pt not willing to pursue this course.   UPDATE 07/06/13 (LL):  Patient comes in for follow up visit at Dr. Kenna Gilbert advice.  Husband recently took patient to see Dr. Loreta Ave after about a full month of not eating well.  Husband speaks mostly for patient, she only nods yes/no to questions, speaks a few words.  He claims he had to beg her to eat and that she said she wasn't hungry.  It is difficult to get her to take her medications.  Spouse reports that she has nearly stopped drinking, that she rarely finished a beer each day in the last week.      REVIEW OF SYSTEMS: Full 14 system review of systems performed and notable only for: all negative  ALLERGIES: Allergies  Allergen Reactions  . Gentamicin   . Polymyxin B   . Mupirocin Rash  . Pneumococcal Vaccines Rash    HOME MEDICATIONS: Outpatient Prescriptions Prior to Visit  Medication Sig Dispense Refill  . ergocalciferol (VITAMIN D2) 50000 UNITS capsule Take 50,000 Units by mouth once a week.      . gabapentin (NEURONTIN) 300 MG capsule Take 1 capsule (300 mg total) by mouth 2 (two) times daily.  60 capsule  5  . HYDROcodone-acetaminophen (NORCO/VICODIN) 5-325 MG per tablet Take 1 tablet by mouth every 8 (eight) hours as needed for pain.  90 tablet  0  . lisinopril (PRINIVIL,ZESTRIL) 10 MG tablet Take 1 tablet (10 mg total) by  mouth daily.  30 tablet  5  . metFORMIN (GLUCOPHAGE) 500 MG tablet Take 1/2 tablet by mouth twice a day with meals.  30 tablet  5  . Multiple Vitamin (MULTIVITAMIN WITH MINERALS) TABS Take 1 tablet by mouth daily.      . ondansetron (ZOFRAN) 4 MG tablet Take 1 tablet (4 mg total) by mouth every 12 (twelve) hours as needed for nausea.  30 tablet  1  . simvastatin (ZOCOR) 40 MG tablet Take 1 tablet (40 mg  total) by mouth at bedtime.  30 tablet  5  . venlafaxine (EFFEXOR) 37.5 MG tablet Take 1 tablet (37.5 mg total) by mouth 2 (two) times daily.  60 tablet  5  . Vitamins-Lipotropics (COMPLEX B-100 PO) Take 1 tablet by mouth daily.      . folic acid (FOLVITE) 1 MG tablet Take 1 tablet (1 mg total) by mouth daily.  30 tablet  1  . HORSE CHESTNUT PO Take 1 tablet by mouth daily.       No facility-administered medications prior to visit.    PAST MEDICAL HISTORY: Past Medical History  Diagnosis Date  . Postlaminectomy syndrome, lumbar region   . Disturbance of skin sensation   . Disorders of sacrum   . Hyperlipidemia   . Lumbosacral spondylosis without myelopathy   . Asthma     Situational (environmental triggers)  . Cancer   . Diabetes mellitus   . Depression   . Hypertension   . Ulcer   . Osteoporosis     PAST SURGICAL HISTORY: Past Surgical History  Procedure Laterality Date  . Spine surgery      2 back procedures  . Antrectomy      Vagotomy done also for severe peptic ulcer disease  . Vagotomy and pyloroplasty    . Eye surgery      Cataracts    FAMILY HISTORY: Family History  Problem Relation Age of Onset  . Asthma Sister   . Allergies Sister   . Diabetes Brother   . Hypertension Brother   . Atrial fibrillation Sister     SOCIAL HISTORY: History   Social History  . Marital Status: Married    Spouse Name: N/A    Number of Children: N/A  . Years of Education: N/A   Occupational History  . Not on file.   Social History Main Topics  . Smoking status: Former Smoker -- 2.00 packs/day for 30 years    Types: Cigarettes    Quit date: 10/19/1990  . Smokeless tobacco: Not on file  . Alcohol Use: 2.4 oz/week    4 Cans of beer per week  . Drug Use: No  . Sexual Activity: Yes   Other Topics Concern  . Not on file   Social History Narrative  . No narrative on file   PHYSICAL EXAM  Filed Vitals:   07/06/13 1203  BP: 168/116  Pulse: 108   There is no  weight on file to calculate BMI.  Physical Exam  General: Patient is awake, alert and in no acute distress.  Smells of urine. Head: normocephalic and atraumatic. Oropharynx benign  Neck: Neck is supple. Cardiovascular: No carotid artery bruits.  Heart is regular rate and rhythm with no murmurs. Musculoskeletal: No deformity   Neurologic Exam  Mental Status: Awake, alert. Language is limited, speaks a few words.  MMSE 19/30 WITH DEFICITS IN ORIENTATION TO TIME, ATTENTION AND CALCULATION, RECALL 1/3, AFT 5, clock drawing 3/4. (Last visit, 11/16/12:MMSE 26/30).  Cranial  Nerves:  Pupils are equal and reactive to light.  Visual fields are full to confrontation.  Conjugate eye movements are full and symmetric.  Facial sensation and strength are symmetric.  Hearing is intact.  Shoulder shrug is symmetric.  Motor: Normal bulk and tone.  Full strength in the upper and lower extremities.  No pronator drift. Sensory: ABSENT VIB AT TOES.  Coordination: No ataxia or dysmetria on finger-nose or rapid alternating movement testing. Gait and Station: ATAXIC GAIT. STAGGERING. UNABLE TO TOE, HEEL OR TANDEM. Reflexes: BUE BRISK (3), KNEES TRACE, ANKLES 0. POSITIVE SNOUT, MYERSONS. NEG PALMOMENTAL. NEG ROOTING.  DIAGNOSTIC DATA (LABS, IMAGING, TESTING) - I reviewed patient records, labs, notes, testing and imaging myself where available.  Lab Results  Component Value Date   WBC 10.6 05/05/2013   HGB 13.4 05/05/2013   HCT 38.3 05/05/2013   MCV 92.7 05/05/2013   PLT 220 05/05/2013      Component Value Date/Time   NA 119* 06/29/2013    K 3.6    CL 82    CO2 21    GLUCOSE 119    BUN 15    CREATININE 1.12    CALCIUM 9.9    ALBUMIN 4.2    AST 44    ALT 36    ALKPHOS 88    BILITOT 1.1    GFRNONAA     GFRAA     Lab Results  Component Value Date   HGBA1C 5.4 05/05/2013   Lab Results  Component Value Date   VITAMINB12 295 07/07/2012   Lab Results  Component Value Date   TSH 4.644 06/29/2013    11/23/12 MRI lumbar spine (without contrast At L4-5: Disc bulging with facet hypertrophy with moderate spinal stenosis, severe right and mild left foraminal stenosis.  At L3-4: Disc bulging with facet and ligamentum flavum hypertrophy with mild spinal stenosis and mild biforaminal stenosis.  At L5-S1: Posterior decompression and bone fusion. No spinal stenosis or foraminal narrowing.  11/23/12 MRI cervical spine (without contrast) At C5-6: Disc bulging with facet hypertrophy with mild-moderate spinal stenosis and severe left foraminal stenosis.  At C3-4: Disc bulging with facet hypertrophy with moderate right and severe left foraminal stenosis.  At C2-3: Facet hypertrophy with mild right foraminal stenosis.  Multi-level degenerative changes. Degenerative spondylosis of C1 anteriorly. Disc bulging and spondylosis from C3-4 to C6-7. 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   12/20/2012 CT HEAD WITHOUT CONTRAST No acute intracranial abnormality. Stable atrophy and chronic white matter disease.   12/20/2012  MRI HEAD WITHOUT AND WITH CONTRAST  Atrophy and chronic microvascular ischemic change. No acute intracranial findings. Suspected degenerative change of the occiput-C1 and C1-2 interspaces. Consider further evaluation with CT or MRI of the cervical spine.   12/20/12 EEG:  normal awake and drowsy EEG. Please, be aware that a normal EEG does not exclude the possibility of epilepsy.    ASSESSMENT AND PLAN 72 year old female with progressive memory problems and gait difficulty. Memory problems likely due to an underlying neurodegenerative process such as Alzheimer's dementia versus alcohol related dementia. Gait difficulty likely multi-factorial, related to brain neurodegenerative process, lumbar stenosis/radiculopathy, and peripheral neuropathy.  Severe hyponatremia and malnutrition related to chronic alcohol abuse.  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  Strongly recommended referral to Psychiatry  for Depression and Alcohol Detox Treatment Program, appointment with PCP tomorrow.   Do not recommend treatment with any dementia medications until general medical condition stabilized.  Ronal Fear, MSN, NP-C 07/06/2013, 1:46 PM [facility] [address] [place], [place] [address] [phone]

## 2013-07-06 NOTE — Patient Instructions (Signed)
Please follow up with your Primary Care MD for referral to Psychiatry and Alcohol Treatment.

## 2013-07-07 ENCOUNTER — Ambulatory Visit: Payer: Medicare Other | Admitting: Family Medicine

## 2013-07-13 ENCOUNTER — Encounter: Payer: Self-pay | Admitting: Family Medicine

## 2013-07-13 ENCOUNTER — Ambulatory Visit (INDEPENDENT_AMBULATORY_CARE_PROVIDER_SITE_OTHER): Payer: Medicare Other | Admitting: Family Medicine

## 2013-07-13 VITALS — BP 100/86 | HR 104 | Resp 16 | Wt 140.0 lb

## 2013-07-13 DIAGNOSIS — Z23 Encounter for immunization: Secondary | ICD-10-CM

## 2013-07-13 DIAGNOSIS — E871 Hypo-osmolality and hyponatremia: Secondary | ICD-10-CM

## 2013-07-13 DIAGNOSIS — R32 Unspecified urinary incontinence: Secondary | ICD-10-CM

## 2013-07-13 DIAGNOSIS — R634 Abnormal weight loss: Secondary | ICD-10-CM

## 2013-07-13 LAB — BASIC METABOLIC PANEL
BUN: 18 mg/dL (ref 6–23)
CO2: 21 mEq/L (ref 19–32)
Creat: 1.04 mg/dL (ref 0.50–1.10)
Glucose, Bld: 159 mg/dL — ABNORMAL HIGH (ref 70–99)
Potassium: 3.9 mEq/L (ref 3.5–5.3)

## 2013-07-13 NOTE — Patient Instructions (Addendum)
Please collect a urine specimen and bring ti back to the clinic in the morning if you can.  Continue to work on improving nutrition- get Glucerna and mix 1/2 bottle with equal amount of 2% milk.his can be a meal substitute. Make some delicious smoothies by adding frozen fruit and yogurt and blending it together. Try to eat small amounts of food every 3-4 hours.  You have an appointment with me in October.   Try to bring the urine specimen back to [facility] tomorrow. I will call you about your labs this weekend.

## 2013-07-14 ENCOUNTER — Telehealth: Payer: Self-pay | Admitting: Family Medicine

## 2013-07-14 LAB — POCT UA - MICROSCOPIC ONLY
Casts, Ur, LPF, POC: NEGATIVE
Yeast, UA: NEGATIVE

## 2013-07-14 LAB — POCT URINALYSIS DIPSTICK
Bilirubin, UA: NEGATIVE
Glucose, UA: NEGATIVE
Nitrite, UA: POSITIVE
Urobilinogen, UA: 0.2

## 2013-07-14 MED ORDER — CEFUROXIME AXETIL 500 MG PO TABS: 500.0000 mg | ORAL_TABLET | Freq: Two times a day (BID) | ORAL | Status: DC

## 2013-07-14 NOTE — Telephone Encounter (Signed)
Contacted husband and advised of UTI and antibiotic to be prescribed (Cefuroxime 500 mg 1 tab bid x 10 days). Sodium improved to 130.

## 2013-07-14 NOTE — Progress Notes (Signed)
S:  This 72 y.o. Cauc female returns for follow-up of hyponatremia, last measurement sodium= 119 on 06/29/2013 at [facility] Dr. Loreta Ave. Pt's neurologic status of concern to Dr. Loreta Ave who referred pt to [facility] For evaluation (see office note dated 9/18/2-14). Overall impression was neurodegenerative process related to either Alzheimer's Dementia versus alcohol related dementia and gait abnormality being multi-factorial. Malnutrition and electrolyte problems related to alcohol abuse. Husband reports that pt has no had any beer in 1 week; she went through a period= 3-4 weeks where she was anorexic. Appetite has returned and husband is trying to increase pt's intake. She will eat breakfast and small amount of food at dinner. She has generalized weakness and lower ext weakness has increased; she has known lumbar DDD.  PMHx, Soc Hx and Problem List reviewed. Medications reconciled.  ROS: As per HPI; negative for fever/chills/ CP or tightness, palpitations, SOB or DOE, edema, n/v/d, rashes/ erythema/pallor, HA, tremor, numbness or syncope .  O: Filed Vitals:   07/13/13 1604  BP: 100/86  Pulse: 104  Resp: 16   GEN: In NAD; WN,WD. Seated in WC. HENT: [place]/AT; EOMI w/ clear conj/sclerae. Moist mucosa. Otherwise unremarkable. NECK: Supple w/o LAN or TMG. LUNGS: Normal resp rate and effort. COR: RRR. SKIN: W&D; no erythema, jaundice or petechiae. MS: Moves UEs easily; LEs moved w/ difficulty. Mild muscle atrophy in LEs. NEURO: A&O x 3; CNs intact. Gait not observed.  LABS collected 06/29/2013 at [facility]:  Na+= 119   K+= 3.6  Cr= 1.12  Direct Bili= 0.4   SGOT= 44  SGPT= 36   TSH= 4.644   WBC= 10.6  Hgb= 13.4  A/P: Weight loss, unintentional - Related to Alcohol dependence; pt now abstaining.  Plan: Basic metabolic panel  Hyponatremia- This is chronic and should improve if pt remains off alcohol and improves nutrition.  Urinary incontinence in female -  Plan: POCT UA - Microscopic Only, POCT urinalysis dipstick, Urine culture  Need for prophylactic vaccination and inoculation against influenza - Plan: Flu Vaccine QUAD 36+ mos IM

## 2013-07-17 LAB — URINE CULTURE: Colony Count: >=100000

## 2013-08-11 ENCOUNTER — Ambulatory Visit: Payer: Medicare Other | Admitting: Family Medicine

## 2013-08-16 IMAGING — CT CT HEAD W/O CM
1 series · 16 of 30 positions shown, 20 images · non-contrast
Comparison: 09/05/2012

CLINICAL DATA: Altered mental status

CT HEAD WITHOUT CONTRAST
TECHNIQUE: Contiguous axial images were obtained from the base of
the skull through the vertex without contrast.

[Series 2: head routine 4.8 h37s · axial · 0.46mm/px · z∈[-114,+46]mm · 16 of 36 slices shown, 20 images]
[im 2/36  brain]
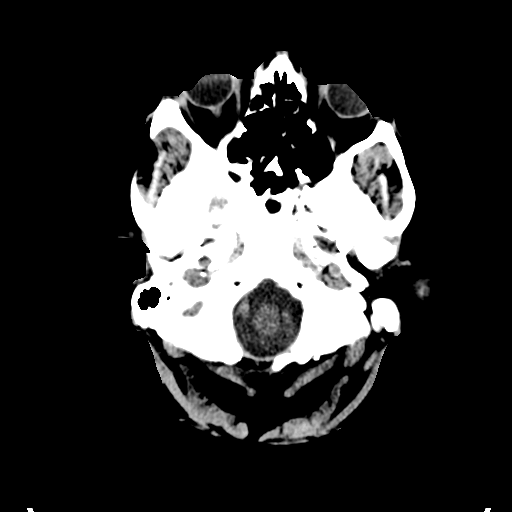
[im 2/36  bone]
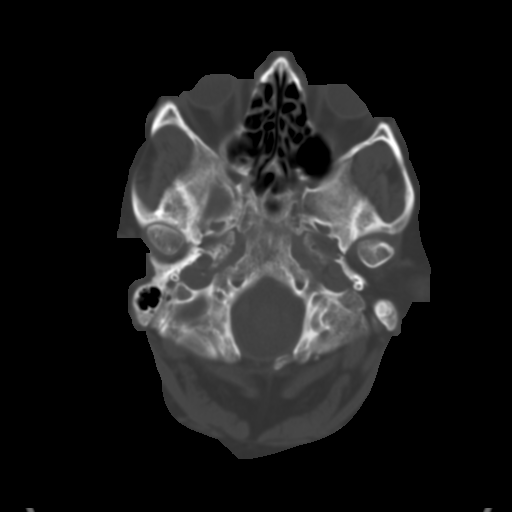
[im 4/36  brain]
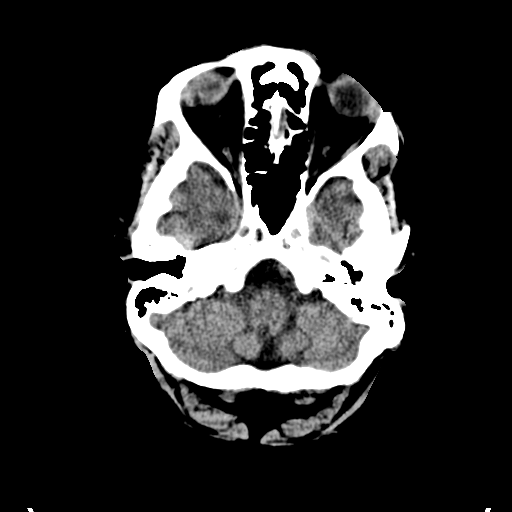
[im 7/36  brain]
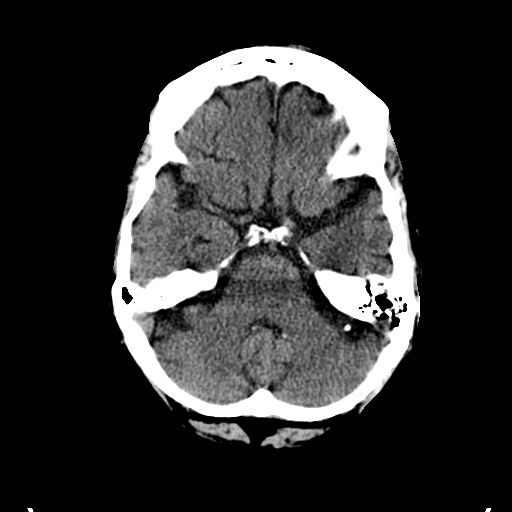
[im 9/36  brain]
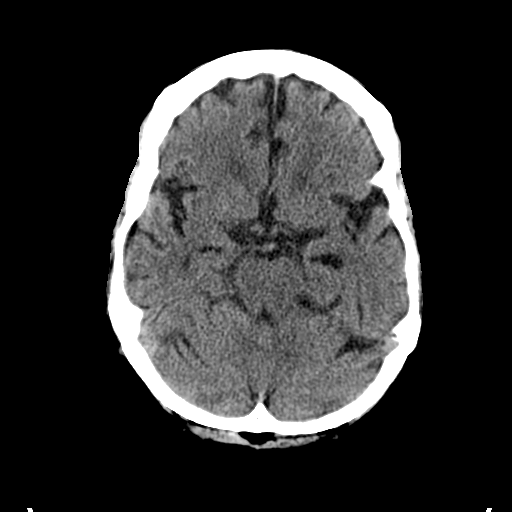
[im 10/36  brain]
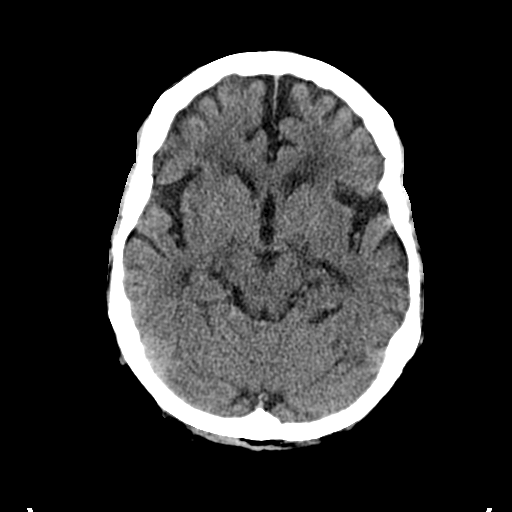
[im 10/36  bone]
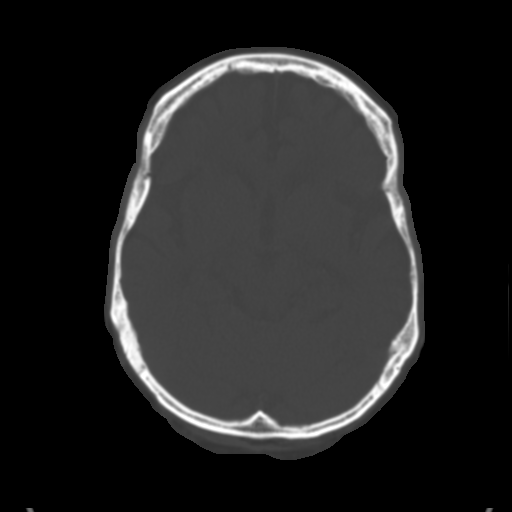
[im 13/36  brain]
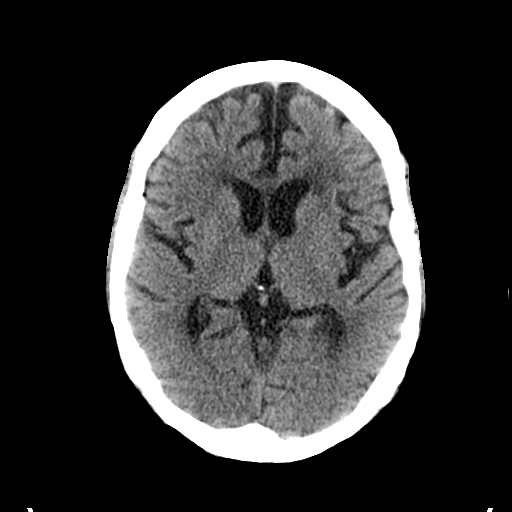
[im 15/36  brain]
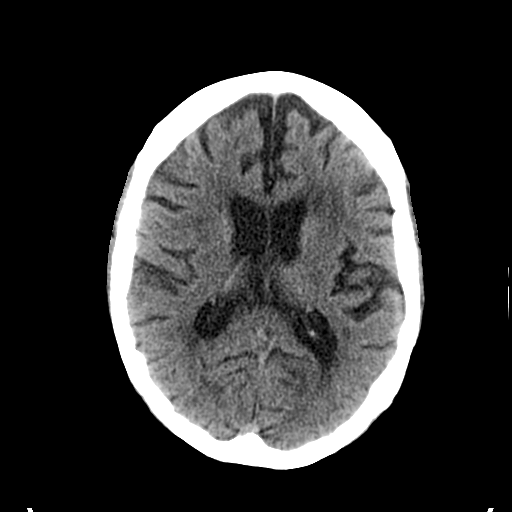
[im 17/36  brain]
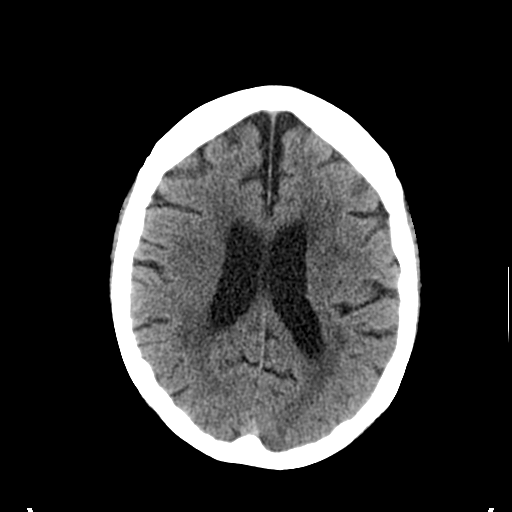
[im 19/36  brain]
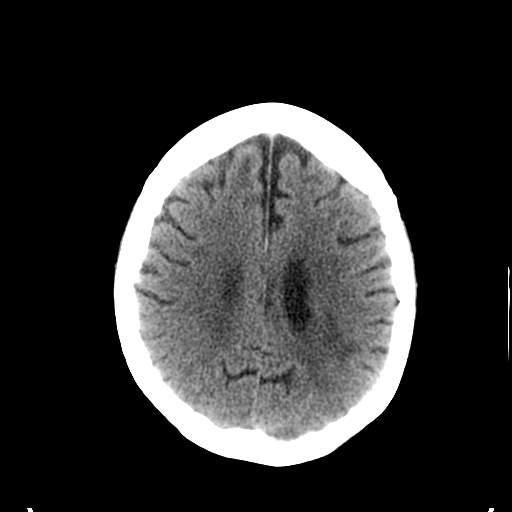
[im 19/36  bone]
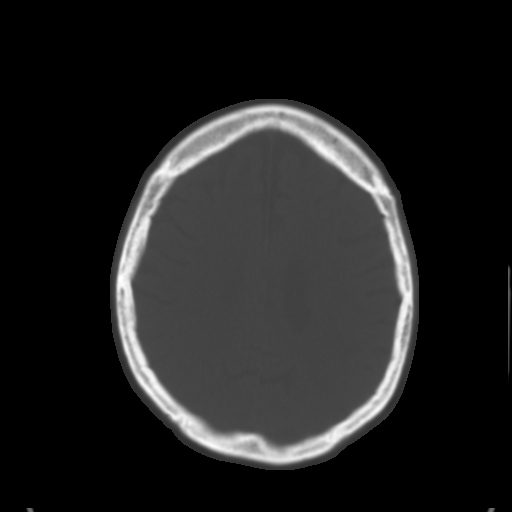
[im 21/36  brain]
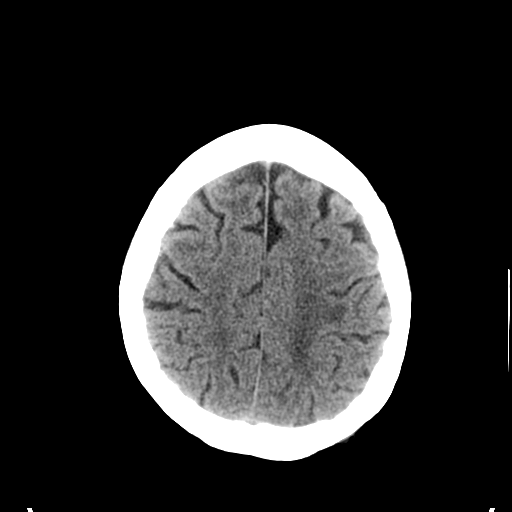
[im 23/36  brain]
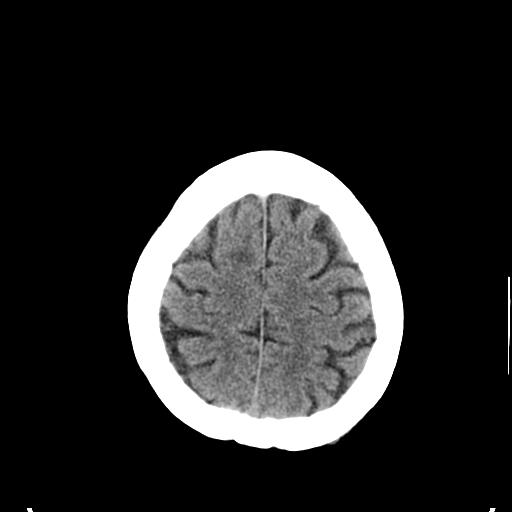
[im 26/36  brain]
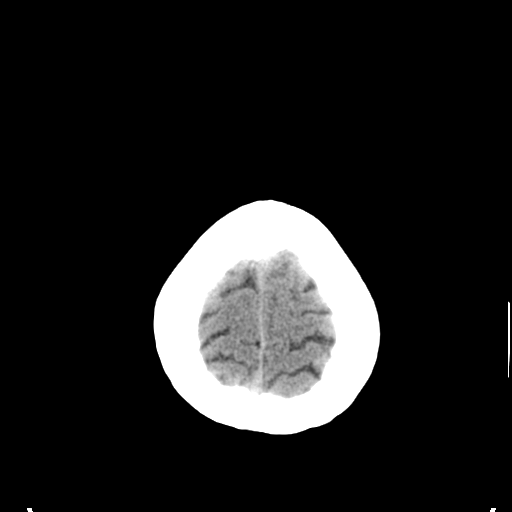
[im 27/36  brain]
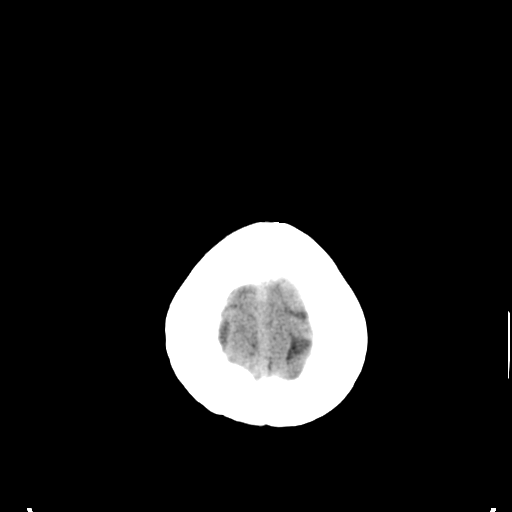
[im 27/36  bone]
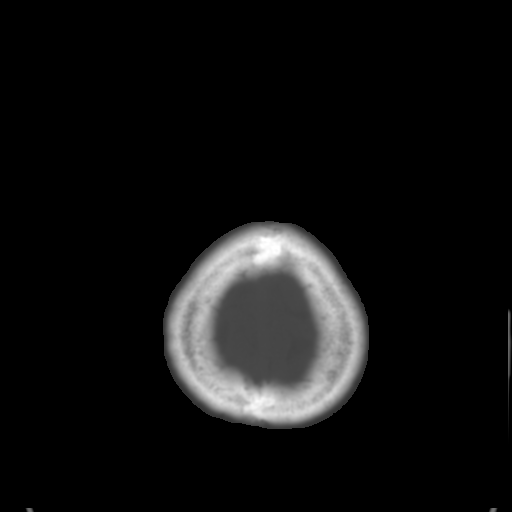
[im 29/36  brain]
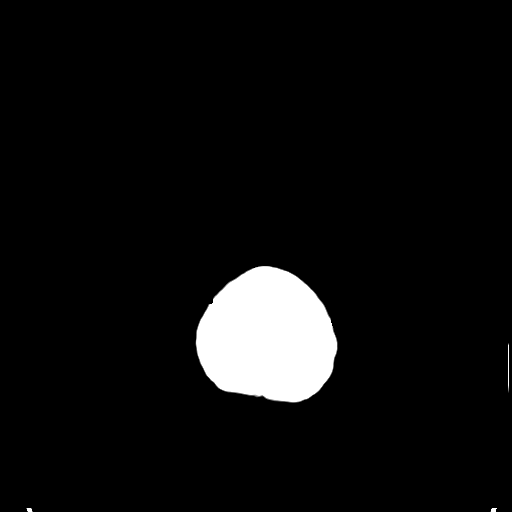
[im 32/36  brain]
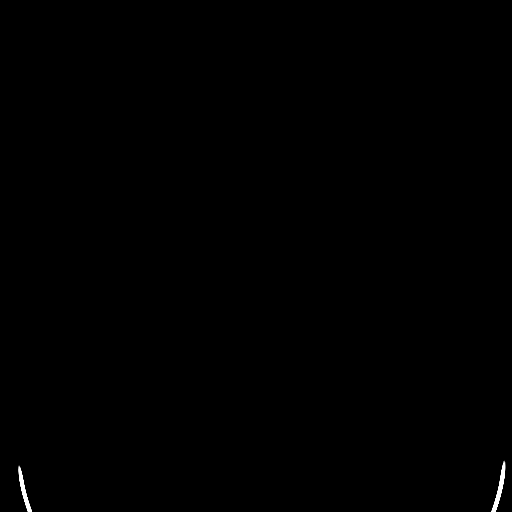
[im 34/36  brain]
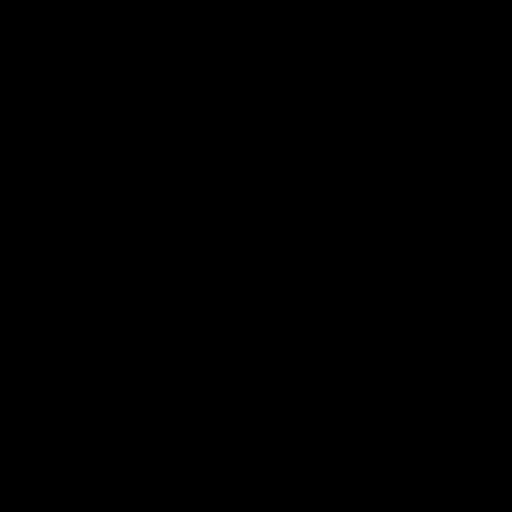

[16 of 30 positions shown; findings below may reference images not displayed]

FINDINGS: No skull fracture is noted.  Paranasal sinuses and mastoid air
cells are unremarkable.

No intracranial hemorrhage, mass effect or midline shift.  Stable
cerebral atrophy.  Stable periventricular and patchy subcortical
white matter decreased attenuation consistent with chronic small
vessel ischemic changes.
No acute cortical infarction.  No mass lesion is noted on this
unenhanced scan.
IMPRESSION: No acute intracranial abnormality.  Stable atrophy and chronic
white matter disease.

## 2013-08-23 ENCOUNTER — Other Ambulatory Visit: Payer: Self-pay | Admitting: Family Medicine

## 2013-08-28 ENCOUNTER — Encounter: Payer: Medicare Other | Admitting: Physical Medicine and Rehabilitation

## 2013-09-12 ENCOUNTER — Encounter: Payer: Self-pay | Admitting: Family Medicine

## 2013-09-12 ENCOUNTER — Telehealth: Payer: Self-pay | Admitting: Family Medicine

## 2013-09-12 ENCOUNTER — Ambulatory Visit (INDEPENDENT_AMBULATORY_CARE_PROVIDER_SITE_OTHER): Payer: Medicare Other | Admitting: Family Medicine

## 2013-09-12 VITALS — BP 148/88 | HR 82 | Temp 97.9°F | Resp 16 | Ht 65.0 in | Wt 140.0 lb

## 2013-09-12 DIAGNOSIS — Z87440 Personal history of urinary (tract) infections: Secondary | ICD-10-CM

## 2013-09-12 DIAGNOSIS — R7989 Other specified abnormal findings of blood chemistry: Secondary | ICD-10-CM

## 2013-09-12 DIAGNOSIS — E871 Hypo-osmolality and hyponatremia: Secondary | ICD-10-CM

## 2013-09-12 DIAGNOSIS — R946 Abnormal results of thyroid function studies: Secondary | ICD-10-CM

## 2013-09-12 DIAGNOSIS — Z9181 History of falling: Secondary | ICD-10-CM

## 2013-09-12 NOTE — Telephone Encounter (Signed)
Pt's husband called, said he had a urine that he had collected from [name] as she was unable to void in the office and he was willing to bring it in today. Per [name], she could not take the urine because it had to be on a different day as the order was put in as a future order and also a first morning void is best. Informed pt to wait until tomorrow to collect another sample to bring in. He understood.

## 2013-09-12 NOTE — Patient Instructions (Signed)
The MRI shows chronic microvasculature ischemic changes - this means that blood flow to the brain matter is compromised and may be responsible for cognitive decline.

## 2013-09-13 ENCOUNTER — Other Ambulatory Visit (INDEPENDENT_AMBULATORY_CARE_PROVIDER_SITE_OTHER): Payer: Medicare Other | Admitting: *Deleted

## 2013-09-13 ENCOUNTER — Encounter: Payer: Self-pay | Admitting: Family Medicine

## 2013-09-13 DIAGNOSIS — Z87440 Personal history of urinary (tract) infections: Secondary | ICD-10-CM

## 2013-09-13 LAB — COMPREHENSIVE METABOLIC PANEL
ALT: 11 U/L (ref 0–35)
Alkaline Phosphatase: 65 U/L (ref 39–117)
BUN: 10 mg/dL (ref 6–23)
Glucose, Bld: 117 mg/dL — ABNORMAL HIGH (ref 70–99)
Potassium: 4.1 mEq/L (ref 3.5–5.3)
Sodium: 134 mEq/L — ABNORMAL LOW (ref 135–145)
Total Bilirubin: 0.6 mg/dL (ref 0.3–1.2)

## 2013-09-13 LAB — POCT URINALYSIS DIPSTICK
Bilirubin, UA: NEGATIVE
Blood, UA: NEGATIVE
Glucose, UA: NEGATIVE
Ketones, UA: NEGATIVE
Leukocytes, UA: NEGATIVE
Nitrite, UA: NEGATIVE

## 2013-09-13 LAB — T4, FREE: Free T4: 0.96 ng/dL (ref 0.80–1.80)

## 2013-09-13 NOTE — Progress Notes (Signed)
Patients' husband dropped off urine for future order.

## 2013-09-13 NOTE — Progress Notes (Signed)
S:  This 72 y.o. Cauc female is here w/ her husband today. She continues to abstain from alcohol; husband admits he was enabling pt since he was the one purchasing beer for pt. She has not had beer since last visit and appetite continue to improve. I reviewed MRI brain with pt and husband. She ambulates around the home, occasionally using a walker. She is unsteady on her feet due to chronic LBP and lumbar DDD. In the office today, she has no assistive devices.  Pt has recurrent hyponatremia; it was last measured 2 months ago. Pt also had elevated LFTs associated w/ alcohol abuse. Thyroid test was abnormal in past and needs to be repeated. Pt does not report new or increased weakness, fatigue, HA, Cp or palpitations, GI problems, n/v/d, numbness, tremor or syncope.  UTI treated last visit; symptoms of frequency and incontinence have resolved per husband.  Patient Active Problem List   Diagnosis Date Noted  . Altered mental status 12/20/2012  . Balance disorder 09/13/2012  . CAD (coronary artery disease) 09/13/2012  . Frequent falls 07/10/2012  . Lumbar postlaminectomy syndrome 02/19/2012  . Lumbar spondylosis 02/19/2012  . Diabetic neuropathy, type II diabetes mellitus 02/19/2012  . Type II diabetes mellitus 12/31/2011  . HTN (hypertension) 12/31/2011  . Depression with anxiety 12/31/2011  . Hypercholesteremia 12/31/2011  . Chronic pain syndrome 12/31/2011   PMHx, Soc Hx and Medications reviewed.  O: Filed Vitals:   09/12/13 1417  BP: 148/88  Pulse: 82  Temp: 97.9 F (36.6 C)  Resp: 16   GEN: In NAD; WN,WD. Appears stated age. HEENT: [place]/AT; EOMI w/ clear conj/sclerae. Otherwise unremarkable. COR: RRR. No edema. LUNGS: Normal resp rate and effort. SKIN: W&D; intact w/o diaphoresis, erythema, jaundice or pallor. NEURO: A&O x 3; CNs intact. Gait is unsteady and antalgic. PSYCH: Pleasant and attentive; speech and motor function slowed. Good eye contact. No agitation and pt is not  withdrawn.   A/P: Hyponatremia - Plan: Comprehensive metabolic panel, CANCELED: POCT urinalysis dipstick  Elevated LFTs - Plan: Comprehensive metabolic panel  Abnormal thyroid blood test - Plan: TSH, T4, Free  History of UTI - Plan: POCT urinalysis dipstick- husband to bring urine specimen to 104 in AM as pt could not provide a specimen today.  Risk for falls- Advised to use walker when ambulating; discussed a more modern walker with attached seating. Pt and husband will discuss this for possible purchase next year.

## 2013-09-13 NOTE — Progress Notes (Signed)
Quick Note:  Please advise pt regarding following labs...   Notify pt/ husband that urine specimen is clear (no signs of infection). ______

## 2013-09-15 ENCOUNTER — Other Ambulatory Visit: Payer: Self-pay | Admitting: Family Medicine

## 2013-09-15 MED ORDER — LEVOTHYROXINE SODIUM 25 MCG PO TABS: 25.0000 ug | ORAL_TABLET | Freq: Every day | ORAL | Status: AC

## 2013-09-15 NOTE — Progress Notes (Signed)
Quick Note:  Please advise pt regarding following labs... Sodium looks very good and is almost normal. All other values look good; blood sugar is very slightly above normal.  Thyroid function results suggest that you may benefit from a very low dose of thyroid supplement. I am prescribing the lowest dose possible, to be taken 1st thing in the morning, separate from all other medications and on an empty stomach. Thyroid test will be checked again at visit in January.  Contact the clinic if you have any questions or concerns.  Copy to pt. ______

## 2013-09-22 ENCOUNTER — Ambulatory Visit: Payer: Medicare Other | Admitting: Physical Medicine and Rehabilitation

## 2013-09-26 ENCOUNTER — Telehealth: Payer: Self-pay | Admitting: *Deleted

## 2013-09-26 NOTE — Telephone Encounter (Addendum)
I spoke with [name] and her back pain has not increased and so does not need to be seen at this time.  She has not had her hydrocodone filled since march or may and would need a new appt. With MD but has no need at this time.  We will cancel the appt 10/04/13 with RN.  She will call and reschedule when she needs to.  Per Erick Colace, MD  :   " I really don't need to see her if she is not taking hydrocodone however she has had radiofrequency in the past and if her back pain is increasing we may need to repeat this. Please ask if back pain is increasing then I would see her to reassess for RF "

## 2013-09-28 ENCOUNTER — Ambulatory Visit: Payer: Medicare Other | Admitting: Physical Medicine and Rehabilitation

## 2013-09-29 ENCOUNTER — Ambulatory Visit: Payer: Medicare Other

## 2013-11-16 ENCOUNTER — Ambulatory Visit: Payer: Medicare Other | Admitting: Family Medicine

## 2014-06-06 ENCOUNTER — Telehealth: Payer: Self-pay | Admitting: *Deleted

## 2014-06-06 NOTE — Telephone Encounter (Signed)
Called and spoke with Rutha Bouchard and advised her to call [facility] and schedule an 65-month diabetes check-up.  She stated she would call back eventually and schedule.

## 2014-07-24 ENCOUNTER — Telehealth: Payer: Self-pay

## 2014-07-24 NOTE — Telephone Encounter (Signed)
Clld pt - advsd of need to schedule Diabetic Care Follow up appt with Dr. Leward Quan. Pt stated she would call tomorrow, Wednesday, October 7 th to schedule appt
